# Patient Record
Sex: Male | Born: 1965 | Race: White | Hispanic: No | Marital: Married | Smoking: Never smoker
Health system: Southern US, Community
[De-identification: ages and names within clinical notes are randomized; demographics above are authoritative.]

## PROBLEM LIST (undated history)

## (undated) DIAGNOSIS — I10 Essential (primary) hypertension: Secondary | ICD-10-CM

## (undated) DIAGNOSIS — E785 Hyperlipidemia, unspecified: Secondary | ICD-10-CM

## (undated) HISTORY — DX: Essential (primary) hypertension: I10

## (undated) HISTORY — DX: Hyperlipidemia, unspecified: E78.5

---
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
## 2019-11-30 ENCOUNTER — Ambulatory Visit: Payer: Self-pay | Admitting: Physician Assistant

## 2019-12-05 ENCOUNTER — Ambulatory Visit: Payer: Self-pay | Admitting: Physician Assistant

## 2019-12-08 ENCOUNTER — Encounter: Payer: Self-pay | Admitting: Physician Assistant

## 2019-12-08 ENCOUNTER — Other Ambulatory Visit: Payer: Self-pay

## 2019-12-08 ENCOUNTER — Ambulatory Visit (INDEPENDENT_AMBULATORY_CARE_PROVIDER_SITE_OTHER): Payer: 59 | Admitting: Physician Assistant

## 2019-12-08 VITALS — BP 110/70 | HR 76 | Temp 98.4°F | Resp 16 | Ht 72.0 in | Wt 195.0 lb

## 2019-12-08 DIAGNOSIS — E785 Hyperlipidemia, unspecified: Secondary | ICD-10-CM | POA: Insufficient documentation

## 2019-12-08 DIAGNOSIS — Z8249 Family history of ischemic heart disease and other diseases of the circulatory system: Secondary | ICD-10-CM | POA: Insufficient documentation

## 2019-12-08 DIAGNOSIS — I10 Essential (primary) hypertension: Secondary | ICD-10-CM | POA: Insufficient documentation

## 2019-12-08 DIAGNOSIS — Z23 Encounter for immunization: Secondary | ICD-10-CM | POA: Diagnosis not present

## 2019-12-08 NOTE — Patient Instructions (Signed)
We will work on getting your records to review to make sure everything is up-to-date.   If you can please have your next stress test results sent to [place] it would be appreciated -- Fax is [phone].  Your flu shot was updated today.  It was very nice meeting you today. Welcome to [facility]!

## 2019-12-08 NOTE — Progress Notes (Signed)
Patient presents to clinic today to establish care.  Acute Concerns: Denies acute concern's today.   Chronic Issues: Hypertension -- Notes ongoing history. Father passed from a MI in his 36s. Patient has been getting stress testing every other year. Is scheduled for next in a few weeks. Denies history of abnormal stress test.. Is currently on a regimen of Lotrel 5/20 mg QD, taking daily as directed. Patient denies chest pain, palpitations, lightheadedness, dizziness, vision changes or frequent headaches. Notes diet is well-balanced but he does like [place] food. Has watched portion sizes and lost several pounds in the past year. Is keeping very active.   Hyperlipidemia -- On Pravachol 40 mg daily as directed and tolerating well.   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 No outbreaks.   Health Maintenance: Immunizations -- Due for flu shot. Would like today.  Colonoscopy -- Colonoscopy last year. Was told he had some polyps (about 3-4 per pt report). Recommended recheck in 5 years.   Past Medical History:  Diagnosis Date  . Hyperlipidemia   . Hypertension      The histories are not reviewed yet. Please review them in the "History" navigator section and refresh this SmartLink.  Current Outpatient Medications on File Prior to Visit  Medication Sig Dispense Refill  . acyclovir (ZOVIRAX) 400 MG tablet Take 400 mg by mouth daily.    Marland Kitchen amLODipine-benazepril (LOTREL) 5-20 MG capsule Take 1 capsule by mouth daily.    . pravastatin (PRAVACHOL) 40 MG tablet Take 40 mg by mouth daily.     No current facility-administered medications on file prior to visit.    Not on File  No family history on file.  Social History   Socioeconomic History  . Marital status: Married    Spouse name: Not on file  . Number of children: Not on file  . Years of education: Not on file  . Highest education level: Not on file  Occupational History  . Not on file  Tobacco Use  . Smoking  status: Not on file  Substance and Sexual Activity  . Alcohol use: Not on file  . Drug use: Not on file  . Sexual activity: Not on file  Other Topics Concern  . Not on file  Social History Narrative  . Not on file   Social Determinants of Health   Financial Resource Strain:   . Difficulty of Paying Living Expenses: Not on file  Food Insecurity:   . Worried About Programme researcher, broadcasting/film/video in the Last Year: Not on file  . Ran Out of Food in the Last Year: Not on file  Transportation Needs:   . Lack of Transportation (Medical): Not on file  . Lack of Transportation (Non-Medical): Not on file  Physical Activity:   . Days of Exercise per Week: Not on file  . Minutes of Exercise per Session: Not on file  Stress:   . Feeling of Stress : Not on file  Social Connections:   . Frequency of Communication with Friends and Family: Not on file  . Frequency of Social Gatherings with Friends and Family: Not on file  . Attends Religious Services: Not on file  . Active Member of Clubs or Organizations: Not on file  . Attends Banker Meetings: Not on file  . Marital Status: Not on file  Intimate Partner Violence:   . Fear of Current or Ex-Partner: Not on file  . Emotionally Abused: Not on file  .  Physically Abused: Not on file  . Sexually Abused: Not on file   ROS Pertinent ROS are listed in the HPI.   Resp 16   Ht 6' (1.829 m)   Wt 195 lb (88.5 kg)   BMI 26.45 kg/m   Physical Exam Vitals reviewed.  Constitutional:      Appearance: Normal appearance.  HENT:     Head: Normocephalic and atraumatic.  Cardiovascular:     Rate and Rhythm: Normal rate and regular rhythm.     Pulses: Normal pulses.     Heart sounds: Normal heart sounds.  Pulmonary:     Effort: Pulmonary effort is normal.     Breath sounds: Normal breath sounds.  Musculoskeletal:     Cervical back: Neck supple.  Neurological:     General: No focal deficit present.     Mental Status: He is alert and oriented  to person, place, and time.    Assessment/Plan: 1. Primary hypertension 2. Hyperlipidemia, unspecified hyperlipidemia type 3. Family history of early CAD BP normotensive.  Asymptomatic.  Is taking blood pressure medication and statin as directed.  Endorses recent lab work.  Records requested.  Is up-to-date on stress testing.  Records of this also requested.  We will have him continue current regimen.  Dietary and exercise recommendations reviewed.  4. Need for immunization against influenza - Flu Vaccine QUAD 36+ mos IM  This visit occurred during the SARS-CoV-2 public health emergency.  Safety protocols were in place, including screening questions prior to the visit, additional usage of staff PPE, and extensive cleaning of exam room while observing appropriate contact time as indicated for disinfecting solutions.    Piedad Climes, PA-C

## 2020-04-10 ENCOUNTER — Telehealth: Payer: Self-pay | Admitting: Physician Assistant

## 2020-04-10 DIAGNOSIS — I10 Essential (primary) hypertension: Secondary | ICD-10-CM

## 2020-04-10 DIAGNOSIS — E785 Hyperlipidemia, unspecified: Secondary | ICD-10-CM

## 2020-04-10 DIAGNOSIS — B009 Herpesviral infection, unspecified: Secondary | ICD-10-CM

## 2020-04-10 MED ORDER — ACYCLOVIR 400 MG PO TABS: 400.0000 mg | ORAL_TABLET | Freq: Every day | ORAL | 0 refills | Status: DC

## 2020-04-10 MED ORDER — PRAVASTATIN SODIUM 40 MG PO TABS: 40.0000 mg | ORAL_TABLET | Freq: Every day | ORAL | 0 refills | Status: DC

## 2020-04-10 MED ORDER — AMLODIPINE BESY-BENAZEPRIL HCL 5-20 MG PO CAPS: 1.0000 | ORAL_CAPSULE | Freq: Every day | ORAL | 0 refills | Status: DC

## 2020-04-10 NOTE — Telephone Encounter (Signed)
Rx sent to the preferred patient pharmacy  

## 2020-04-10 NOTE — Telephone Encounter (Signed)
..  Medication Refills  Last OV:  Medication:  Pravostatin, Amlodipine, Acyclovier   Pharmacy:  [facility]   Let patient know to contact pharmacy at the end of the day to make sure medication is ready.   Please notify patient to allow 48-72 hours to process.  Encourage patient to contact the pharmacy for refills or they can request refills through [facility]  Clinical Fills out below:   Last refill:  QTY:  Refill Date:    Other Comments:   Okay for refill?  Please advise.

## 2020-07-19 ENCOUNTER — Ambulatory Visit (INDEPENDENT_AMBULATORY_CARE_PROVIDER_SITE_OTHER): Payer: 59 | Admitting: Family Medicine

## 2020-07-19 ENCOUNTER — Other Ambulatory Visit: Payer: Self-pay

## 2020-07-19 ENCOUNTER — Encounter: Payer: Self-pay | Admitting: Family Medicine

## 2020-07-19 VITALS — BP 132/78 | HR 72 | Temp 98.2°F | Resp 16 | Ht 72.0 in | Wt 189.8 lb

## 2020-07-19 DIAGNOSIS — Z23 Encounter for immunization: Secondary | ICD-10-CM

## 2020-07-19 DIAGNOSIS — B009 Herpesviral infection, unspecified: Secondary | ICD-10-CM

## 2020-07-19 DIAGNOSIS — E785 Hyperlipidemia, unspecified: Secondary | ICD-10-CM

## 2020-07-19 DIAGNOSIS — I10 Essential (primary) hypertension: Secondary | ICD-10-CM

## 2020-07-19 LAB — COMPREHENSIVE METABOLIC PANEL
ALT: 29 U/L (ref 0–53)
AST: 22 U/L (ref 0–37)
Albumin: 4.8 g/dL (ref 3.5–5.2)
Alkaline Phosphatase: 66 U/L (ref 39–117)
BUN: 12 mg/dL (ref 6–23)
CO2: 28 mEq/L (ref 19–32)
Calcium: 9.6 mg/dL (ref 8.4–10.5)
Chloride: 103 mEq/L (ref 96–112)
Creatinine, Ser: 1 mg/dL (ref 0.40–1.50)
GFR: 85.01 mL/min (ref >60.00)
Glucose, Bld: 85 mg/dL (ref 70–99)
Potassium: 4.8 mEq/L (ref 3.5–5.1)
Sodium: 139 mEq/L (ref 135–145)
Total Bilirubin: 0.7 mg/dL (ref 0.2–1.2)
Total Protein: 7.1 g/dL (ref 6.0–8.3)

## 2020-07-19 LAB — LIPID PANEL
Cholesterol: 203 mg/dL — ABNORMAL HIGH (ref 0–200)
HDL: 58.9 mg/dL (ref >39.00)
LDL Cholesterol: 111 mg/dL — ABNORMAL HIGH (ref 0–99)
NonHDL: 143.63
Total CHOL/HDL Ratio: 3
Triglycerides: 165 mg/dL — ABNORMAL HIGH (ref 0.0–149.0)
VLDL: 33 mg/dL (ref 0.0–40.0)

## 2020-07-19 MED ORDER — ACYCLOVIR 400 MG PO TABS: 400.0000 mg | ORAL_TABLET | Freq: Every day | ORAL | 3 refills | Status: DC

## 2020-07-19 MED ORDER — AMLODIPINE BESY-BENAZEPRIL HCL 5-20 MG PO CAPS: 1.0000 | ORAL_CAPSULE | Freq: Every day | ORAL | 3 refills | Status: DC

## 2020-07-19 MED ORDER — PRAVASTATIN SODIUM 40 MG PO TABS: 40.0000 mg | ORAL_TABLET | Freq: Every day | ORAL | 3 refills | Status: DC

## 2020-07-19 NOTE — Progress Notes (Signed)
Subjective:  Patient ID: Vincent Case, male    DOB: 02/13/1965  Age: 55 y.o. MRN: [id number]  CC:  Chief Complaint  Patient presents with   Establish Care    Pt reports here for establish care/TOC reports doing well moved here 1 year ago from [facility]     HPI Vincent Case presents for   Establish care.   Lived in [place] area prior to [place]. Moved 1 year ago.  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   Hypertension: Lotrel 5/20mg  qd. No new side effects.  Home readings: 120/80. BP Readings from Last 3 Encounters:  07/19/20 132/78  12/08/19 110/70   No results found for: CREATININE  Hyperlipidemia: Pravastatin 40mg  qd. No other statin prior. No new side effects/myalgias.  No personal cardiac history. Family history of cardiac disease. Father deceased in 64's with MI.  Had some labs with executive physical 12/14/19.  At total cholesterol 197, triglycerides 149, HDL 55, LDL 116.  CMP within normal limits.  A1c 5.0.  TSH normal at 2.37.  Normal CBC with hemoglobin 13.6.  Normal urinalysis, no proteinuria, negative glucose.  EKG SR, WNL on 12/14/19.  Walking for exercise. No CP with exertion. No new DOE.   No results found for: CHOL, HDL, LDLCALC, LDLDIRECT, TRIG, CHOLHDL No results found for: ALT, AST, GGT, ALKPHOS, BILITOT   Hx of cold sores: Takes acyclovir daily - no recent flair.   Considering covid booster # 2  Shingles vaccine today.    History Patient Active Problem List   Diagnosis Date Noted   Hypertension 12/08/2019   Hyperlipidemia 12/08/2019   Family history of early CAD 12/08/2019   Past Medical History:  Diagnosis Date   Hyperlipidemia    Hypertension    Past Surgical History:  Procedure Laterality Date   FRACTURE SURGERY  1984   No Known Allergies Prior to Admission medications   Medication Sig Start Date End Date Taking? Authorizing Provider  acyclovir (ZOVIRAX) 400 MG tablet Take 1 tablet (400 mg total) by mouth daily. 04/10/20  Yes  06/10/20 B, FNP  amLODipine-benazepril (LOTREL) 5-20 MG capsule Take 1 capsule by mouth daily. 04/10/20  Yes 06/10/20 B, FNP  Multiple Vitamins-Minerals (MENS MULTIVITAMIN PO) Take by mouth.   Yes [provider]  pravastatin (PRAVACHOL) 40 MG tablet Take 1 tablet (40 mg total) by mouth daily. 04/10/20  Yes 06/10/20, FNP   Social History   Socioeconomic History   Marital status: Married    Spouse name: Not on file   Number of children: Not on file   Years of education: Not on file   Highest education level: Not on file  Occupational History   Not on file  Tobacco Use   Smoking status: Never   Smokeless tobacco: Never  Vaping Use   Vaping Use: Never used  Substance and Sexual Activity   Alcohol use: Yes   Drug use: Never   Sexual activity: Yes    Birth control/protection: None  Other Topics Concern   Not on file  Social History Narrative   Not on file   Social Determinants of Health   Financial Resource Strain: Not on file  Food Insecurity: Not on file  Transportation Needs: Not on file  Physical Activity: Not on file  Stress: Not on file  Social Connections: Not on file  Intimate Partner Violence: Not on file    Review of Systems  Constitutional:  Negative for fatigue and unexpected  weight change.  Eyes:  Negative for visual disturbance.  Respiratory:  Negative for cough, chest tightness and shortness of breath.   Cardiovascular:  Negative for chest pain, palpitations and leg swelling.  Gastrointestinal:  Negative for abdominal pain and blood in stool.  Neurological:  Negative for dizziness, light-headedness and headaches.    Objective:   Vitals:   07/19/20 1024  BP: 132/78  Pulse: 72  Resp: 16  Temp: 98.2 F (36.8 C)  TempSrc: Temporal  SpO2: 98%  Weight: 189 lb 12.8 oz (86.1 kg)  Height: 6' (1.829 m)     Physical Exam Vitals reviewed.  Constitutional:      Appearance: He is well-developed.  HENT:     Head: Normocephalic  and atraumatic.  Neck:     Vascular: No carotid bruit or JVD.  Cardiovascular:     Rate and Rhythm: Normal rate and regular rhythm.     Heart sounds: Normal heart sounds. No murmur heard. Pulmonary:     Effort: Pulmonary effort is normal.     Breath sounds: Normal breath sounds. No rales.  Musculoskeletal:     Right lower leg: No edema.     Left lower leg: No edema.  Skin:    General: Skin is warm and dry.  Neurological:     Mental Status: He is alert and oriented to person, place, and time.  Psychiatric:        Mood and Affect: Mood normal.       Assessment & Plan:  Vincent Case is a 55 y.o. male . Hyperlipidemia, unspecified hyperlipidemia type - Plan: pravastatin (PRAVACHOL) 40 MG tablet, Comprehensive metabolic panel, Lipid panel  -Tolerating current regimen.  Would like to see LDL below 100, even towards 70 may be beneficial given family history of early cardiac disease.  Check labs, consider Crestor or Lipitor.  Option of coronary calcium scoring at some point as well.  Executive labs planned in November, can have labs done at that time and bring those to visit with me in 6 months.  HSV-1 infection - Plan: acyclovir (ZOVIRAX) 400 MG tablet  -Stable with daily dosing of acyclovir for prevention.  Increase dose if needed for flare.  Primary hypertension - Plan: amLODipine-benazepril (LOTREL) 5-20 MG capsule  -Stable on current regimen, continue same, labs above.  Need for shingles vaccine - Plan: Varicella-zoster vaccine IM given   Meds ordered this encounter  Medications   acyclovir (ZOVIRAX) 400 MG tablet    Sig: Take 1 tablet (400 mg total) by mouth daily.    Dispense:  90 tablet    Refill:  3   amLODipine-benazepril (LOTREL) 5-20 MG capsule    Sig: Take 1 capsule by mouth daily.    Dispense:  90 capsule    Refill:  3   pravastatin (PRAVACHOL) 40 MG tablet    Sig: Take 1 tablet (40 mg total) by mouth daily.    Dispense:  90 tablet    Refill:  3   Patient  Instructions  Good meeting you today.  No change in meds for now but I would consider a different statin medication depending on your test results.  We will let you know by MyChart.  Follow-up in 6 months for physical but please bring a copy of your labs from your executive physical at that time.  Please let me know if there are questions sooner.    Signed,   Meredith Staggers, MD [facility], [facility] 07/19/20 1:57  PM

## 2020-07-19 NOTE — Patient Instructions (Signed)
Good meeting you today.  No change in meds for now but I would consider a different statin medication depending on your test results.  We will let you know by MyChart.  Follow-up in 6 months for physical but please bring a copy of your labs from your executive physical at that time.  Please let me know if there are questions sooner.

## 2020-07-30 ENCOUNTER — Other Ambulatory Visit: Payer: Self-pay

## 2020-07-30 ENCOUNTER — Encounter: Payer: Self-pay | Admitting: Family Medicine

## 2020-07-30 DIAGNOSIS — E785 Hyperlipidemia, unspecified: Secondary | ICD-10-CM

## 2020-07-30 MED ORDER — ROSUVASTATIN CALCIUM 10 MG PO TABS
10.0000 mg | ORAL_TABLET | Freq: Every day | ORAL | 1 refills | Status: DC
Start: 2020-07-30 — End: 2020-12-23

## 2020-07-30 MED ORDER — ROSUVASTATIN CALCIUM 10 MG PO TABS: 10.0000 mg | ORAL_TABLET | Freq: Every day | ORAL | 1 refills | Status: DC

## 2020-07-30 NOTE — Telephone Encounter (Signed)
Patient is needing this sent to [facility] - [place], [place] - [address]

## 2020-08-02 NOTE — Telephone Encounter (Signed)
Called patient.  Answered questions regarding use of Crestor.  Did note on the instructions to be cautious if regular alcohol use.  Drinks approximately 2 glasses of red wine per day.  Has been doing so with his previous cholesterol medication, normal LFTs at last visit, do not anticipate significant concern with new med but will monitor LFTs at follow-up visit.  All questions were answered with understanding of plan expressed

## 2020-08-28 ENCOUNTER — Ambulatory Visit (INDEPENDENT_AMBULATORY_CARE_PROVIDER_SITE_OTHER): Payer: 59 | Admitting: Family Medicine

## 2020-08-28 ENCOUNTER — Other Ambulatory Visit: Payer: Self-pay

## 2020-08-28 VITALS — BP 126/68 | HR 79 | Temp 98.3°F | Resp 17 | Ht 72.0 in | Wt 191.6 lb

## 2020-08-28 DIAGNOSIS — M542 Cervicalgia: Secondary | ICD-10-CM

## 2020-08-28 DIAGNOSIS — I10 Essential (primary) hypertension: Secondary | ICD-10-CM | POA: Diagnosis not present

## 2020-08-28 DIAGNOSIS — R0789 Other chest pain: Secondary | ICD-10-CM | POA: Diagnosis not present

## 2020-08-28 DIAGNOSIS — E785 Hyperlipidemia, unspecified: Secondary | ICD-10-CM

## 2020-08-28 NOTE — Progress Notes (Signed)
Subjective:  Patient ID: Vincent Case, male    DOB: 1965-07-11  Age: 55 y.o. MRN: [id number]  CC:  Chief Complaint  Patient presents with   Hypertension    Pt here to discuss BP reports some high readings at home denies physical sxs, is noting some tighness of his pectoral or chest wall "like ive done 100 push ups"     HPI Adonus Uselman presents for   Hypertension: Treated with Lotrel 5/20 daily.  Some elevated readings at home when he started feeling some chest symptoms - 135/88. Min dizziness working outside and bending over/standing up a few times.  BP Readings from Last 3 Encounters:  08/28/20 126/68  07/19/20 132/78  12/08/19 110/70   Lab Results  Component Value Date   CREATININE 1.00 07/19/2020   Hyperlipidemia: Previously on pravastatin 40 mg daily, elevated readings noted at recent visit.  Changed to Crestor 10 mg daily. Started 6 days ago after returned from travel. No new side effects or specific new myalgia in past 6 days.   Lab Results  Component Value Date   CHOL 203 (H) 07/19/2020   HDL 58.90 07/19/2020   LDLCALC 111 (H) 07/19/2020   TRIG 165.0 (H) 07/19/2020   CHOLHDL 3 07/19/2020   Lab Results  Component Value Date   ALT 29 07/19/2020   AST 22 07/19/2020   ALKPHOS 66 07/19/2020   BILITOT 0.7 07/19/2020   Chest discomfort Started 8-9 days ago. Noted one morning.  No associated dizziness/dyspnea/nausea/sweating.  Able to feel with moving around/stretching, but not exertion.  No pressure/squeezing. Both sides. Stress test every other year has been ok - last one at [facility] - 2019.  No hx of heart issues, but FH of MI in father in his 36's.  Traveling recently. Some neck/upper back soreness and sensation in chest - similar to feeling after use of butterfly chest machine, but not sore to touch on muscles. Better with new pillow.    History Patient Active Problem List   Diagnosis Date Noted   Hypertension 12/08/2019   Hyperlipidemia 12/08/2019    Family history of early CAD 12/08/2019   Past Medical History:  Diagnosis Date   Hyperlipidemia    Hypertension    Past Surgical History:  Procedure Laterality Date   FRACTURE SURGERY  1984   No Known Allergies Prior to Admission medications   Medication Sig Start Date End Date Taking? Authorizing Provider  acyclovir (ZOVIRAX) 400 MG tablet Take 1 tablet (400 mg total) by mouth daily. 07/19/20  Yes Shade Flood, MD  amLODipine-benazepril (LOTREL) 5-20 MG capsule Take 1 capsule by mouth daily. 07/19/20  Yes Shade Flood, MD  Multiple Vitamins-Minerals (MENS MULTIVITAMIN PO) Take by mouth.   Yes [provider]  rosuvastatin (CRESTOR) 10 MG tablet Take 1 tablet (10 mg total) by mouth daily. 07/30/20  Yes Shade Flood, MD   Social History   Socioeconomic History   Marital status: Married    Spouse name: Not on file   Number of children: Not on file   Years of education: Not on file   Highest education level: Not on file  Occupational History   Not on file  Tobacco Use   Smoking status: Never   Smokeless tobacco: Never  Vaping Use   Vaping Use: Never used  Substance and Sexual Activity   Alcohol use: Yes   Drug use: Never   Sexual activity: Yes    Birth control/protection: None  Other Topics Concern  Not on file  Social History Narrative   Not on file   Social Determinants of Health   Financial Resource Strain: Not on file  Food Insecurity: Not on file  Transportation Needs: Not on file  Physical Activity: Not on file  Stress: Not on file  Social Connections: Not on file  Intimate Partner Violence: Not on file    Review of Systems  Constitutional:  Negative for fatigue and unexpected weight change.  Eyes:  Negative for visual disturbance.  Respiratory:  Negative for cough and shortness of breath.   Cardiovascular:  Negative for chest pain (sensation above, not painful.), palpitations and leg swelling.  Gastrointestinal:  Negative for  abdominal pain and blood in stool.  Neurological:  Negative for dizziness, weakness, light-headedness and headaches.    Objective:   Vitals:   08/28/20 1121  BP: 126/68  Pulse: 79  Resp: 17  Temp: 98.3 F (36.8 C)  TempSrc: Temporal  SpO2: 97%  Weight: 191 lb 9.6 oz (86.9 kg)  Height: 6' (1.829 m)     Physical Exam Vitals reviewed.  Constitutional:      Appearance: He is well-developed.  HENT:     Head: Normocephalic and atraumatic.  Neck:     Vascular: No carotid bruit or JVD.  Cardiovascular:     Rate and Rhythm: Normal rate and regular rhythm.     Heart sounds: Normal heart sounds. No murmur heard. Pulmonary:     Effort: Pulmonary effort is normal.     Breath sounds: Normal breath sounds. No rales.  Musculoskeletal:     Right lower leg: No edema.     Left lower leg: No edema.     Comments: Small nontender, pain-free pectoralis resistance testing, unable to reproduce chest discomfort on exam.  C-spine with near full range of motion, slight discomfort into the upper back but no reproduction of chest symptoms.  Skin:    General: Skin is warm and dry.  Neurological:     Mental Status: He is alert and oriented to person, place, and time.  Psychiatric:        Mood and Affect: Mood normal.     EKG: Sinus rhythm, rate 80.  Unable to read V2.  No apparent significant ST or T wave changes, Q waves.  Compared to December 14, 2018, no apparent significant change   Assessment & Plan:  Kingsten Enfield is a 55 y.o. male . Chest discomfort - Plan: EKG 12-Lead, Ambulatory referral to Cardiology Neck soreness  -Nonspecific/atypical chest discomfort.  Likely musculoskeletal has noticed some neck, upper back issues with recent travel.  Some improvement in those areas with change in pillow and now back home.  EKG without apparent concerning findings, unable to reproduce discomfort on exam.  Does have family history of early cardiac disease.  Previous stress testing reportedly  normal when he was in [place].  -Refer to cardiology locally, with ER/urgent care/RTC precautions regarding chest symptoms.  -Tylenol, symptomatic care discussed for neck, upper back discomfort.  Hyperlipidemia, unspecified hyperlipidemia type - Plan: Ambulatory referral to Cardiology  -Now on Crestor past 6 days.  Tolerating so far and chest symptoms above started prior to change to Crestor.  Recheck in 5 weeks for repeat labs.  Primary hypertension - Plan: Ambulatory referral to Cardiology  -Controlled in office, likely slight elevated readings when stressed about chest symptoms as above.  Handout given on appropriate home monitoring.  Maintenance of hydration discussed when working outside to lessen orthostatic/hypotensive symptoms.  Recheck 5  weeks, sooner if worse.  No orders of the defined types were placed in this encounter.  Patient Instructions  Chest sensation may be related to muscles or pinched nerves from neck and recent travel.  I do not see any obvious concerns on her EKG but will refer you to cardiology.  Be seen right away here, urgent care or emergency room if acute worsening of symptoms.  Tylenol is fine if needed for discomfort in your neck and upper back, gentle range of motion and stretches, heat if needed. Return to the clinic or go to the nearest emergency room if any of your symptoms worsen or new symptoms occur.  No change in medications for now.  Make sure to drink sufficient fluids when you are outside working.  See information below on appropriate monitoring of blood pressure.  Recheck in 5 weeks for repeat cholesterol testing.  Let me know if there are questions sooner.  How to Take Your Blood Pressure Blood pressure is a measurement of how strongly your blood is pressing against the walls of your arteries. Arteries are blood vessels that carry blood from your heart throughout your body. Your health care provider takes your blood pressure at each office visit. You  can also take your own blood pressure athome with a blood pressure monitor. You may need to take your own blood pressure to: Confirm a diagnosis of high blood pressure (hypertension). Monitor your blood pressure over time. Make sure your blood pressure medicine is working. Supplies needed: Blood pressure monitor. Dining room chair to sit in. Table or desk. Small notebook and pencil or pen. How to prepare To get the most accurate reading, avoid the following for 30 minutes before you check your blood pressure: Drinking caffeine. Drinking alcohol. Eating. Smoking. Exercising. Five minutes before you check your blood pressure: Use the bathroom and urinate so that you have an empty bladder. Sit quietly in a dining room chair. Do not sit in a soft couch or an armchair. Do not talk. How to take your blood pressure To check your blood pressure, follow the instructions in the manual that came with your blood pressure monitor. If you have a digital blood pressure monitor, the instructions may be as follows: Sit up straight in a chair. Place your feet on the floor. Do not cross your ankles or legs. Rest your left arm at the level of your heart on a table or desk or on the arm of a chair. Pull up your shirt sleeve. Wrap the blood pressure cuff around the upper part of your left arm, 1 inch (2.5 cm) above your elbow. It is best to wrap the cuff around bare skin. Fit the cuff snugly around your arm. You should be able to place only one finger between the cuff and your arm. Position the cord so that it rests in the bend of your elbow. Press the power button. Sit quietly while the cuff inflates and deflates. Read the digital reading on the monitor screen and write the numbers down (record them) in a notebook. Wait 2-3 minutes, then repeat the steps, starting at step 1. What does my blood pressure reading mean? A blood pressure reading consists of a higher number over a lower number. Ideally, your  blood pressure should be below 120/80. The first ("top") number is called the systolic pressure. It is a measure of the pressure in your arteries as your heart beats. The second ("bottom") number is called the diastolic pressure. It is a measure of  the pressure in your arteries as theheart relaxes. Blood pressure is classified into five stages. The following are the stages for adults who do not have a short-term serious illness or a chronic condition. Systolic pressure and diastolic pressure are measured in a unit called mm Hg (millimeters of mercury). Normal Systolic pressure: below 120. Diastolic pressure: below 80. Elevated Systolic pressure: 120-129. Diastolic pressure: below 80. Hypertension stage 1 Systolic pressure: 130-139. Diastolic pressure: 80-89. Hypertension stage 2 Systolic pressure: 140 or above. Diastolic pressure: 90 or above. You can have elevated blood pressure or hypertension even if only the systolicor only the diastolic number in your reading is higher than normal. Follow these instructions at home: Medicines Take over-the-counter and prescription medicines only as told by your health care provider. Tell your health care provider if you are having any side effects from blood pressure medicine. General instructions Check your blood pressure as often as recommended by your health care provider. Check your blood pressure at the same time every day. Take your monitor to the next appointment with your health care provider to make sure that: You are using it correctly. It provides accurate readings. Understand what your goal blood pressure numbers are. Keep all follow-up visits as told by your health care provider. This is important. General tips Your health care provider can suggest a reliable monitor that will meet your needs. There are several types of home blood pressure monitors. Choose a monitor that has an arm cuff. Do not choose a monitor that measures your blood  pressure from your wrist or finger. Choose a cuff that wraps snugly around your upper arm. You should be able to fit only one finger between your arm and the cuff. You can buy a blood pressure monitor at most drugstores or online. Where to find more information American Heart Association: www.heart.org Contact a health care provider if: Your blood pressure is consistently high. Your blood pressure is suddenly low. Get help right away if: Your systolic blood pressure is higher than 180. Your diastolic blood pressure is higher than 120. Summary Blood pressure is a measurement of how strongly your blood is pressing against the walls of your arteries. A blood pressure reading consists of a higher number over a lower number. Ideally, your blood pressure should be below 120/80. Check your blood pressure at the same time every day. Avoid caffeine, alcohol, smoking, and exercise for 30 minutes prior to checking your blood pressure. These agents can affect the accuracy of the blood pressure reading. This information is not intended to replace advice given to you by your health care provider. Make sure you discuss any questions you have with your healthcare provider. Document Revised: 12/06/2019 Document Reviewed: 01/20/2019 Elsevier Patient Education  2022 Elsevier Inc.    Signed,   Meredith StaggersJeffrey Yaniris Braddock, MD [facility], [facility] 08/28/20 12:34 PM

## 2020-08-28 NOTE — Patient Instructions (Signed)
Chest sensation may be related to muscles or pinched nerves from neck and recent travel.  I do not see any obvious concerns on her EKG but will refer you to cardiology.  Be seen right away here, urgent care or emergency room if acute worsening of symptoms.  Tylenol is fine if needed for discomfort in your neck and upper back, gentle range of motion and stretches, heat if needed. Return to the clinic or go to the nearest emergency room if any of your symptoms worsen or new symptoms occur.  No change in medications for now.  Make sure to drink sufficient fluids when you are outside working.  See information below on appropriate monitoring of blood pressure.  Recheck in 5 weeks for repeat cholesterol testing.  Let me know if there are questions sooner.  How to Take Your Blood Pressure Blood pressure is a measurement of how strongly your blood is pressing against the walls of your arteries. Arteries are blood vessels that carry blood from your heart throughout your body. Your health care provider takes your blood pressure at each office visit. You can also take your own blood pressure athome with a blood pressure monitor. You may need to take your own blood pressure to: Confirm a diagnosis of high blood pressure (hypertension). Monitor your blood pressure over time. Make sure your blood pressure medicine is working. Supplies needed: Blood pressure monitor. Dining room chair to sit in. Table or desk. Small notebook and pencil or pen. How to prepare To get the most accurate reading, avoid the following for 30 minutes before you check your blood pressure: Drinking caffeine. Drinking alcohol. Eating. Smoking. Exercising. Five minutes before you check your blood pressure: Use the bathroom and urinate so that you have an empty bladder. Sit quietly in a dining room chair. Do not sit in a soft couch or an armchair. Do not talk. How to take your blood pressure To check your blood pressure, follow the  instructions in the manual that came with your blood pressure monitor. If you have a digital blood pressure monitor, the instructions may be as follows: Sit up straight in a chair. Place your feet on the floor. Do not cross your ankles or legs. Rest your left arm at the level of your heart on a table or desk or on the arm of a chair. Pull up your shirt sleeve. Wrap the blood pressure cuff around the upper part of your left arm, 1 inch (2.5 cm) above your elbow. It is best to wrap the cuff around bare skin. Fit the cuff snugly around your arm. You should be able to place only one finger between the cuff and your arm. Position the cord so that it rests in the bend of your elbow. Press the power button. Sit quietly while the cuff inflates and deflates. Read the digital reading on the monitor screen and write the numbers down (record them) in a notebook. Wait 2-3 minutes, then repeat the steps, starting at step 1. What does my blood pressure reading mean? A blood pressure reading consists of a higher number over a lower number. Ideally, your blood pressure should be below 120/80. The first ("top") number is called the systolic pressure. It is a measure of the pressure in your arteries as your heart beats. The second ("bottom") number is called the diastolic pressure. It is a measure of the pressure in your arteries as theheart relaxes. Blood pressure is classified into five stages. The following are the stages for  adults who do not have a short-term serious illness or a chronic condition. Systolic pressure and diastolic pressure are measured in a unit called mm Hg (millimeters of mercury). Normal Systolic pressure: below 120. Diastolic pressure: below 80. Elevated Systolic pressure: 120-129. Diastolic pressure: below 80. Hypertension stage 1 Systolic pressure: 130-139. Diastolic pressure: 80-89. Hypertension stage 2 Systolic pressure: 140 or above. Diastolic pressure: 90 or above. You can have  elevated blood pressure or hypertension even if only the systolicor only the diastolic number in your reading is higher than normal. Follow these instructions at home: Medicines Take over-the-counter and prescription medicines only as told by your health care provider. Tell your health care provider if you are having any side effects from blood pressure medicine. General instructions Check your blood pressure as often as recommended by your health care provider. Check your blood pressure at the same time every day. Take your monitor to the next appointment with your health care provider to make sure that: You are using it correctly. It provides accurate readings. Understand what your goal blood pressure numbers are. Keep all follow-up visits as told by your health care provider. This is important. General tips Your health care provider can suggest a reliable monitor that will meet your needs. There are several types of home blood pressure monitors. Choose a monitor that has an arm cuff. Do not choose a monitor that measures your blood pressure from your wrist or finger. Choose a cuff that wraps snugly around your upper arm. You should be able to fit only one finger between your arm and the cuff. You can buy a blood pressure monitor at most drugstores or online. Where to find more information American Heart Association: www.heart.org Contact a health care provider if: Your blood pressure is consistently high. Your blood pressure is suddenly low. Get help right away if: Your systolic blood pressure is higher than 180. Your diastolic blood pressure is higher than 120. Summary Blood pressure is a measurement of how strongly your blood is pressing against the walls of your arteries. A blood pressure reading consists of a higher number over a lower number. Ideally, your blood pressure should be below 120/80. Check your blood pressure at the same time every day. Avoid caffeine, alcohol,  smoking, and exercise for 30 minutes prior to checking your blood pressure. These agents can affect the accuracy of the blood pressure reading. This information is not intended to replace advice given to you by your health care provider. Make sure you discuss any questions you have with your healthcare provider. Document Revised: 12/06/2019 Document Reviewed: 01/20/2019 Elsevier Patient Education  2022 [employer].

## 2020-10-02 ENCOUNTER — Other Ambulatory Visit: Payer: 59

## 2020-10-04 ENCOUNTER — Other Ambulatory Visit: Payer: Self-pay

## 2020-10-04 ENCOUNTER — Ambulatory Visit (INDEPENDENT_AMBULATORY_CARE_PROVIDER_SITE_OTHER): Payer: 59 | Admitting: Family Medicine

## 2020-10-04 ENCOUNTER — Encounter: Payer: Self-pay | Admitting: Family Medicine

## 2020-10-04 VITALS — BP 110/76 | HR 88 | Temp 98.4°F | Resp 16 | Ht 72.0 in | Wt 191.6 lb

## 2020-10-04 DIAGNOSIS — S39012A Strain of muscle, fascia and tendon of lower back, initial encounter: Secondary | ICD-10-CM

## 2020-10-04 DIAGNOSIS — M436 Torticollis: Secondary | ICD-10-CM | POA: Diagnosis not present

## 2020-10-04 DIAGNOSIS — E785 Hyperlipidemia, unspecified: Secondary | ICD-10-CM

## 2020-10-04 DIAGNOSIS — R0789 Other chest pain: Secondary | ICD-10-CM | POA: Diagnosis not present

## 2020-10-04 LAB — LIPID PANEL
Cholesterol: 180 mg/dL (ref 0–200)
HDL: 60.3 mg/dL (ref >39.00)
LDL Cholesterol: 90 mg/dL (ref 0–99)
NonHDL: 119.81
Total CHOL/HDL Ratio: 3
Triglycerides: 147 mg/dL (ref 0.0–149.0)
VLDL: 29.4 mg/dL (ref 0.0–40.0)

## 2020-10-04 LAB — HEPATIC FUNCTION PANEL
ALT: 28 U/L (ref 0–53)
AST: 23 U/L (ref 0–37)
Albumin: 4.7 g/dL (ref 3.5–5.2)
Alkaline Phosphatase: 56 U/L (ref 39–117)
Bilirubin, Direct: 0.2 mg/dL (ref 0.0–0.3)
Total Bilirubin: 1 mg/dL (ref 0.2–1.2)
Total Protein: 7.4 g/dL (ref 6.0–8.3)

## 2020-10-04 MED ORDER — CYCLOBENZAPRINE HCL 5 MG PO TABS: 5.0000 mg | ORAL_TABLET | Freq: Every evening | ORAL | 0 refills | Status: DC | PRN

## 2020-10-04 NOTE — Progress Notes (Signed)
Subjective:  Patient ID: Vincent Case, male    DOB: 04-19-1965  Age: 55 y.o. MRN: [id number]  CC:  Chief Complaint  Patient presents with   Neck Pain    Patient is here for a recheck of neck, chest and labs. Patient states neck is still bothering him   Back Pain    Patient states he thinks he may have lifted something wrong and his lower back has been bothering him    HPI Vincent Case presents for   Chest discomfort Discussed at July 20 visit.  Nonspecific/atypical symptoms at that time, thought to be musculoskeletal as some neck/upper back issues with recent travel and some improvement at that time.  EKG without concerning findings and unable to reproduce discomfort on exam.  Did have a history of family history of early cardiac disease so referred to cardiology.  Tylenol, symptomatic care for neck and upper back discomfort discussed.  Appointment with cardiology September 14.  Chest better- not feeling anything recently.  Neck is still sore at times - stiff in am past year, better overall, loosens up during day and with movement. Has tried change in furniture/desk. Min changes.  Tx: CBD oil for neck - felt much better.  No difficulty sleeping.  No neck XR. No injury/fall. No arm weakness. Occasional finger dysesthesias in middle of night only.   Hyperlipidemia: Previously started on Crestor, had been taking for 6 days at his July 28 visit. No new myalgias, side effects.   Lab Results  Component Value Date   CHOL 203 (H) 07/19/2020   HDL 58.90 07/19/2020   LDLCALC 111 (H) 07/19/2020   TRIG 165.0 (H) 07/19/2020   CHOLHDL 3 07/19/2020   Lab Results  Component Value Date   ALT 29 07/19/2020   AST 22 07/19/2020   ALKPHOS 66 07/19/2020   BILITOT 0.7 07/19/2020   Low back pain Noticed after lifting heavy object few nights ago - noticed soreness next day.  R low back to center. Getting better.  No leg radiation.  No bowel or bladder incontinence, no saddle anesthesia,  no lower extremity weakness.  No fever/unexplained wt loss or night sweats.  Tx: stretches.    History Patient Active Problem List   Diagnosis Date Noted   Hypertension 12/08/2019   Hyperlipidemia 12/08/2019   Family history of early CAD 12/08/2019   Past Medical History:  Diagnosis Date   Hyperlipidemia    Hypertension    Past Surgical History:  Procedure Laterality Date   FRACTURE SURGERY  1984   No Known Allergies Prior to Admission medications   Medication Sig Start Date End Date Taking? Authorizing Provider  acyclovir (ZOVIRAX) 400 MG tablet Take 1 tablet (400 mg total) by mouth daily. 07/19/20  Yes Shade Flood, MD  amLODipine-benazepril (LOTREL) 5-20 MG capsule Take 1 capsule by mouth daily. 07/19/20  Yes Shade Flood, MD  Multiple Vitamins-Minerals (MENS MULTIVITAMIN PO) Take by mouth.   Yes [provider]  rosuvastatin (CRESTOR) 10 MG tablet Take 1 tablet (10 mg total) by mouth daily. 07/30/20  Yes Shade Flood, MD   Social History   Socioeconomic History   Marital status: Married    Spouse name: Not on file   Number of children: Not on file   Years of education: Not on file   Highest education level: Not on file  Occupational History   Not on file  Tobacco Use   Smoking status: Never   Smokeless tobacco: Never  Vaping Use  Vaping Use: Never used  Substance and Sexual Activity   Alcohol use: Yes   Drug use: Never   Sexual activity: Yes    Birth control/protection: None  Other Topics Concern   Not on file  Social History Narrative   Not on file   Social Determinants of Health   Financial Resource Strain: Not on file  Food Insecurity: Not on file  Transportation Needs: Not on file  Physical Activity: Not on file  Stress: Not on file  Social Connections: Not on file  Intimate Partner Violence: Not on file    Review of Systems   Objective:   Vitals:   10/04/20 1021  BP: 110/76  Pulse: 88  Resp: 16  Temp: 98.4 F  (36.9 C)  TempSrc: Temporal  SpO2: 98%  Weight: 191 lb 9.6 oz (86.9 kg)  Height: 6' (1.829 m)     Physical Exam Vitals reviewed.  Constitutional:      Appearance: He is well-developed.  HENT:     Head: Normocephalic and atraumatic.  Neck:     Vascular: No carotid bruit or JVD.  Cardiovascular:     Rate and Rhythm: Normal rate and regular rhythm.     Heart sounds: Normal heart sounds. No murmur heard. Pulmonary:     Effort: Pulmonary effort is normal.     Breath sounds: Normal breath sounds. No rales.  Musculoskeletal:     Right lower leg: No edema.     Left lower leg: No edema.     Comments: C-spine, overall full range of motion, lacking approximately 10 to 15 degrees of extension.  Minimal discomfort, crepitance with range of motion.  No midline bony tenderness or paraspinal tenderness.  Lumbar spine, full range of motion, no midline bony tenderness, able to heel and toe walk without difficulty.  Negative seated straight leg raise.    Skin:    General: Skin is warm and dry.  Neurological:     Mental Status: He is alert and oriented to person, place, and time.  Psychiatric:        Mood and Affect: Mood normal.       Assessment & Plan:  Vincent Case is a 55 y.o. male . Neck stiffness - Plan: cyclobenzaprine (FLEXERIL) 5 MG tablet, DG Cervical Spine Complete  -Possible underlying degenerative changes but overall intact range of motion, doing well with home stretches and range of motion.  Option of Flexeril if needed with potential side effects discussed, check imaging, consider PT.  Low back strain, initial encounter - Plan: cyclobenzaprine (FLEXERIL) 5 MG tablet  -Improving, Flexeril if needed, RTC precautions, handout given.  Reassuring exam.  Deferred imaging at this time.  Hyperlipidemia, unspecified hyperlipidemia type - Plan: Lipid panel, Hepatic function panel  -Tolerating Crestor, check labs.  Chest discomfort  -Improved.  No recent symptoms.  Still  recommend cardiology follow-up given family history but potentially musculoskeletal cause previously.  RTC precautions.  Meds ordered this encounter  Medications   cyclobenzaprine (FLEXERIL) 5 MG tablet    Sig: Take 1 tablet (5 mg total) by mouth at bedtime as needed for muscle spasms (start qhs prn due to sedation).    Dispense:  15 tablet    Refill:  0   Patient Instructions  Please have neck xray at [facility] office.  Muscle relaxant if needed. Continue range of motion/stretches. I would consider physical therapy if continued neck symptoms.  See info on back pain below, follow up if not continuing to improve.  Acute Back Pain, Adult Acute back pain is sudden and usually short-lived. It is often caused by an injury to the muscles and tissues in the back. The injury may result from: A muscle or ligament getting overstretched or torn (strained). Ligaments are tissues that connect bones to each other. Lifting something improperly can cause a back strain. Wear and tear (degeneration) of the spinal disks. Spinal disks are circular tissue that provide cushioning between the bones of the spine (vertebrae). Twisting motions, such as while playing sports or doing yard work. A hit to the back. Arthritis. You may have a physical exam, lab tests, and imaging tests to find the cause ofyour pain. Acute back pain usually goes away with rest and home care. Follow these instructions at home: Managing pain, stiffness, and swelling Treatment may include medicines for pain and inflammation that are taken by mouth or applied to the skin, prescription pain medicine, or muscle relaxants. Take over-the-counter and prescription medicines only as told by your health care provider. Your health care provider may recommend applying ice during the first 24-48 hours after your pain starts. To do this: Put ice in a plastic bag. Place a towel between your skin and the bag. Leave the ice on for 20 minutes, 2-3 times  a day. If directed, apply heat to the affected area as often as told by your health care provider. Use the heat source that your health care provider recommends, such as a moist heat pack or a heating pad. Place a towel between your skin and the heat source. Leave the heat on for 20-30 minutes. Remove the heat if your skin turns bright red. This is especially important if you are unable to feel pain, heat, or cold. You have a greater risk of getting burned. Activity  Do not stay in bed. Staying in bed for more than 1-2 days can delay your recovery. Sit up and stand up straight. Avoid leaning forward when you sit or hunching over when you stand. If you work at a desk, sit close to it so you do not need to lean over. Keep your chin tucked in. Keep your neck drawn back, and keep your elbows bent at a 90-degree angle (right angle). Sit high and close to the steering wheel when you drive. Add lower back (lumbar) support to your car seat, if needed. Take short walks on even surfaces as soon as you are able. Try to increase the length of time you walk each day. Do not sit, drive, or stand in one place for more than 30 minutes at a time. Sitting or standing for long periods of time can put stress on your back. Do not drive or use heavy machinery while taking prescription pain medicine. Use proper lifting techniques. When you bend and lift, use positions that put less stress on your back: [place] your knees. Keep the load close to your body. Avoid twisting. Exercise regularly as told by your health care provider. Exercising helps your back heal faster and helps prevent back injuries by keeping muscles strong and flexible. Work with a physical therapist to make a safe exercise program, as recommended by your health care provider. Do any exercises as told by your physical therapist.  Lifestyle Maintain a healthy weight. Extra weight puts stress on your back and makes it difficult to have good posture. Avoid  activities or situations that make you feel anxious or stressed. Stress and anxiety increase muscle tension and can make back pain worse. Learn ways to  manage anxiety and stress, such as through exercise. General instructions Sleep on a firm mattress in a comfortable position. Try lying on your side with your knees slightly bent. If you lie on your back, put a pillow under your knees. Follow your treatment plan as told by your health care provider. This may include: Cognitive or behavioral therapy. Acupuncture or massage therapy. Meditation or yoga. Contact a health care provider if: You have pain that is not relieved with rest or medicine. You have increasing pain going down into your legs or buttocks. Your pain does not improve after 2 weeks. You have pain at night. You lose weight without trying. You have a fever or chills. Get help right away if: You develop new bowel or bladder control problems. You have unusual weakness or numbness in your arms or legs. You develop nausea or vomiting. You develop abdominal pain. You feel faint. Summary Acute back pain is sudden and usually short-lived. Use proper lifting techniques. When you bend and lift, use positions that put less stress on your back. Take over-the-counter and prescription medicines and apply heat or ice as directed by your health care provider. This information is not intended to replace advice given to you by your health care provider. Make sure you discuss any questions you have with your healthcare provider. Document Revised: 10/17/2019 Document Reviewed: 10/20/2019 Elsevier Patient Education  2022 Elsevier Inc.    Signed,   Meredith StaggersJeffrey Borghild Thaker, MD [facility], [facility] 10/04/20 12:52 PM

## 2020-10-04 NOTE — Patient Instructions (Signed)
Please have neck xray at [facility] office.  Muscle relaxant if needed. Continue range of motion/stretches. I would consider physical therapy if continued neck symptoms.  See info on back pain below, follow up if not continuing to improve.   Acute Back Pain, Adult Acute back pain is sudden and usually short-lived. It is often caused by an injury to the muscles and tissues in the back. The injury may result from: A muscle or ligament getting overstretched or torn (strained). Ligaments are tissues that connect bones to each other. Lifting something improperly can cause a back strain. Wear and tear (degeneration) of the spinal disks. Spinal disks are circular tissue that provide cushioning between the bones of the spine (vertebrae). Twisting motions, such as while playing sports or doing yard work. A hit to the back. Arthritis. You may have a physical exam, lab tests, and imaging tests to find the cause ofyour pain. Acute back pain usually goes away with rest and home care. Follow these instructions at home: Managing pain, stiffness, and swelling Treatment may include medicines for pain and inflammation that are taken by mouth or applied to the skin, prescription pain medicine, or muscle relaxants. Take over-the-counter and prescription medicines only as told by your health care provider. Your health care provider may recommend applying ice during the first 24-48 hours after your pain starts. To do this: Put ice in a plastic bag. Place a towel between your skin and the bag. Leave the ice on for 20 minutes, 2-3 times a day. If directed, apply heat to the affected area as often as told by your health care provider. Use the heat source that your health care provider recommends, such as a moist heat pack or a heating pad. Place a towel between your skin and the heat source. Leave the heat on for 20-30 minutes. Remove the heat if your skin turns bright red. This is especially important if you are  unable to feel pain, heat, or cold. You have a greater risk of getting burned. Activity  Do not stay in bed. Staying in bed for more than 1-2 days can delay your recovery. Sit up and stand up straight. Avoid leaning forward when you sit or hunching over when you stand. If you work at a desk, sit close to it so you do not need to lean over. Keep your chin tucked in. Keep your neck drawn back, and keep your elbows bent at a 90-degree angle (right angle). Sit high and close to the steering wheel when you drive. Add lower back (lumbar) support to your car seat, if needed. Take short walks on even surfaces as soon as you are able. Try to increase the length of time you walk each day. Do not sit, drive, or stand in one place for more than 30 minutes at a time. Sitting or standing for long periods of time can put stress on your back. Do not drive or use heavy machinery while taking prescription pain medicine. Use proper lifting techniques. When you bend and lift, use positions that put less stress on your back: [place] your knees. Keep the load close to your body. Avoid twisting. Exercise regularly as told by your health care provider. Exercising helps your back heal faster and helps prevent back injuries by keeping muscles strong and flexible. Work with a physical therapist to make a safe exercise program, as recommended by your health care provider. Do any exercises as told by your physical therapist.  Lifestyle Maintain a healthy weight.  Extra weight puts stress on your back and makes it difficult to have good posture. Avoid activities or situations that make you feel anxious or stressed. Stress and anxiety increase muscle tension and can make back pain worse. Learn ways to manage anxiety and stress, such as through exercise. General instructions Sleep on a firm mattress in a comfortable position. Try lying on your side with your knees slightly bent. If you lie on your back, put a pillow under your  knees. Follow your treatment plan as told by your health care provider. This may include: Cognitive or behavioral therapy. Acupuncture or massage therapy. Meditation or yoga. Contact a health care provider if: You have pain that is not relieved with rest or medicine. You have increasing pain going down into your legs or buttocks. Your pain does not improve after 2 weeks. You have pain at night. You lose weight without trying. You have a fever or chills. Get help right away if: You develop new bowel or bladder control problems. You have unusual weakness or numbness in your arms or legs. You develop nausea or vomiting. You develop abdominal pain. You feel faint. Summary Acute back pain is sudden and usually short-lived. Use proper lifting techniques. When you bend and lift, use positions that put less stress on your back. Take over-the-counter and prescription medicines and apply heat or ice as directed by your health care provider. This information is not intended to replace advice given to you by your health care provider. Make sure you discuss any questions you have with your healthcare provider. Document Revised: 10/17/2019 Document Reviewed: 10/20/2019 Elsevier Patient Education  2022 [employer].

## 2020-10-17 ENCOUNTER — Other Ambulatory Visit: Payer: Self-pay

## 2020-10-17 ENCOUNTER — Ambulatory Visit (INDEPENDENT_AMBULATORY_CARE_PROVIDER_SITE_OTHER)
Admission: RE | Admit: 2020-10-17 | Discharge: 2020-10-17 | Disposition: A | Discharge status: Home / Self Care | Payer: 59 | Source: Ambulatory Visit | Attending: Family Medicine | Admitting: Family Medicine

## 2020-10-17 DIAGNOSIS — M436 Torticollis: Secondary | ICD-10-CM

## 2020-10-23 ENCOUNTER — Other Ambulatory Visit: Payer: Self-pay

## 2020-10-23 ENCOUNTER — Ambulatory Visit (INDEPENDENT_AMBULATORY_CARE_PROVIDER_SITE_OTHER): Payer: 59 | Admitting: Cardiology

## 2020-10-23 ENCOUNTER — Encounter (HOSPITAL_BASED_OUTPATIENT_CLINIC_OR_DEPARTMENT_OTHER): Payer: Self-pay | Admitting: Cardiology

## 2020-10-23 VITALS — BP 126/80 | HR 73 | Ht 72.0 in | Wt 194.0 lb

## 2020-10-23 DIAGNOSIS — Z8249 Family history of ischemic heart disease and other diseases of the circulatory system: Secondary | ICD-10-CM

## 2020-10-23 DIAGNOSIS — R079 Chest pain, unspecified: Secondary | ICD-10-CM | POA: Diagnosis not present

## 2020-10-23 DIAGNOSIS — E785 Hyperlipidemia, unspecified: Secondary | ICD-10-CM

## 2020-10-23 DIAGNOSIS — I10 Essential (primary) hypertension: Secondary | ICD-10-CM

## 2020-10-23 DIAGNOSIS — Z7189 Other specified counseling: Secondary | ICD-10-CM

## 2020-10-23 DIAGNOSIS — Z713 Dietary counseling and surveillance: Secondary | ICD-10-CM

## 2020-10-23 MED ORDER — METOPROLOL TARTRATE 50 MG PO TABS: ORAL_TABLET | ORAL | 0 refills | Status: DC

## 2020-10-23 NOTE — Patient Instructions (Signed)
Medication Instructions:  Your physician recommends that you continue on your current medications as directed. Please refer to the Current Medication list given to you today.  *If you need a refill on your cardiac medications before your next appointment, please call your pharmacy*   Lab Work: BMET prior to CT scan (if not completed at your primary doctor)  If you have labs (blood work) drawn today and your tests are completely normal, you will receive your results only by: MyChart Message (if you have MyChart) OR A paper copy in the mail If you have any lab test that is abnormal or we need to change your treatment, we will call you to review the results.   Testing/Procedures: Coronary CTA-see instructions below  Follow-Up: At [facility], you and your health needs are our priority.  As part of our continuing mission to provide you with exceptional heart care, we have created designated Provider Care Teams.  These Care Teams include your primary Cardiologist (physician) and Advanced Practice Providers (APPs -  Physician Assistants and Nurse Practitioners) who all work together to provide you with the care you need, when you need it.  We recommend signing up for the patient portal called "MyChart".  Sign up information is provided on this After Visit Summary.  MyChart is used to connect with patients for Virtual Visits (Telemedicine).  Patients are able to view lab/test results, encounter notes, upcoming appointments, etc.  Non-urgent messages can be sent to your provider as well.   To learn more about what you can do with MyChart, go to [email or web address].    Your next appointment:   Pending test results with Dr. Cristal Deer  Other Instructions   Your cardiac CT will be scheduled at one of the below locations:   [facility] [address] [place], [place] [address] [phone]  If scheduled at [facility], please arrive at the [facility] main  entrance (entrance A) of [facility] 30 minutes prior to test start time. Proceed to the [facility] Radiology Department (first floor) to check-in and test prep.  Please follow these instructions carefully (unless otherwise directed):  Hold all erectile dysfunction medications at least 3 days (72 hrs) prior to test.  On the Night Before the Test: Be sure to Drink plenty of water. Do not consume any caffeinated/decaffeinated beverages or chocolate 12 hours prior to your test. Do not take any antihistamines 12 hours prior to your test.  On the Day of the Test: Drink plenty of water until 1 hour prior to the test. Do not eat any food 4 hours prior to the test. You may take your regular medications prior to the test.  Take metoprolol (Lopressor) two hours prior to test.      After the Test: Drink plenty of water. After receiving IV contrast, you may experience a mild flushed feeling. This is normal. On occasion, you may experience a mild rash up to 24 hours after the test. This is not dangerous. If this occurs, you can take Benadryl 25 mg and increase your fluid intake. If you experience trouble breathing, this can be serious. If it is severe call 911 IMMEDIATELY. If it is mild, please call our office. If you take any of these medications: Glipizide/Metformin, Avandament, Glucavance, please do not take 48 hours after completing test unless otherwise instructed.  Please allow 2-4 weeks for scheduling of routine cardiac CTs. Some insurance companies require a pre-authorization which may delay scheduling of this test.  For non-scheduling related questions, please contact the cardiac imaging nurse navigator should you have any questions/concerns: [place]well [place], Cardiac Imaging Nurse Navigator Larey Brick, Cardiac Imaging Nurse Navigator [facility] Direct Office Dial: [phone]   For scheduling needs, including cancellations and rescheduling, please  call [place], [phone].

## 2020-10-23 NOTE — Progress Notes (Signed)
Cardiology Office Note:    Date:  10/23/2020   ID:  Vincent Case, DOB Jan 26, 1966, MRN [id number]  PCP:  Shade Flood, MD  Cardiologist:  Jodelle Red, MD  Referring MD: Shade Flood, MD   CC: new patient consultation for chest pain  History of Present Illness:    Vincent Case is a 55 y.o. male with a hx of hypertension, hyperlipidemia, family history of CAD who is seen as a new consult at the request of Shade Flood, MD for the evaluation and management of chest pain.  Note from 10/04/20 with Dr. Neva Seat reviewed. Had noted atypical chest pain in July, resolved at that visit.  Today: Chest pain: -Quality: wouldn't call it pain. Would call it tightness. Felt initially in high bilateral lateral pec area. Has also had neck discomfort in lower posterior neck for the last year. 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 -Onset: several months to a year -Frequency/duration: constant, could be every day. Never radiated. -Associated symptoms: no nausea, diaphoresis, shortness of breath -Aggravating/alleviating factors: not different if he pushed on the area, but felt better when he stretched  -Prior cardiac history: none -Prior workup: used to do a stress test every other year with his physical, has had 5-6, all normal. Has had echo done as well. All done in [place], [place]. Always told it was normal -Prior treatment: none -Alcohol: several glasses of red wine/night, and occasional beer -Tobacco: never -Comorbidities: hypertension, hyperlipidemia. Denies diabetes, kidney issues -Exercise level: used to be more active, but has been concerned with recent findings in his neck x-rays. Walks the dog, used to play more soccer and baseball. No physical limitations -Cardiac ROS: no shortness of breath, no PND, no orthopnea, no LE edema, no syncope -Family history: biological father died when patient was 2, father was in his mid-40s. Had a  heart attack, was a smoker. No other history known. Mother has been generally healthy, recently had a heart issue at age 51 (not sure what it is, will get more information). Has had hypertension.  No siblings.  Past Medical History:  Diagnosis Date   Hyperlipidemia    Hypertension     Past Surgical History:  Procedure Laterality Date   FRACTURE SURGERY  1984    Current Medications: Current Outpatient Medications on File Prior to Visit  Medication Sig   acyclovir (ZOVIRAX) 400 MG tablet Take 1 tablet (400 mg total) by mouth daily.   amLODipine-benazepril (LOTREL) 5-20 MG capsule Take 1 capsule by mouth daily.   Multiple Vitamins-Minerals (MENS MULTIVITAMIN PO) Take by mouth.   rosuvastatin (CRESTOR) 10 MG tablet Take 1 tablet (10 mg total) by mouth daily.   cyclobenzaprine (FLEXERIL) 5 MG tablet Take 1 tablet (5 mg total) by mouth at bedtime as needed for muscle spasms (start qhs prn due to sedation). (Patient not taking: Reported on 10/23/2020)   No current facility-administered medications on file prior to visit.     Allergies:   Patient has no known allergies.   Social History   Tobacco Use   Smoking status: Never   Smokeless tobacco: Never  Vaping Use   Vaping Use: Never used  Substance Use Topics   Alcohol use: Yes   Drug use: Never    Family History: family history includes Cancer in his maternal grandmother; Early death in his father.  ROS:   Please see the history of present illness.  Additional pertinent ROS: Constitutional: Negative for chills, fever,  night sweats, unintentional weight loss  HENT: Negative for ear pain and hearing loss.   Eyes: Negative for loss of vision and eye pain.  Respiratory: Negative for cough, sputum, wheezing.   Cardiovascular: See HPI. Gastrointestinal: Negative for abdominal pain, melena, and hematochezia.  Genitourinary: Negative for dysuria and hematuria.  Musculoskeletal: Negative for falls and myalgias.  Skin: Negative for  itching and rash.  Neurological: Negative for focal weakness, focal sensory changes and loss of consciousness.  Endo/Heme/Allergies: Does not bruise/bleed easily.     EKGs/Labs/Other Studies Reviewed:    The following studies were reviewed today: Echo 2019 scanned in our system, EF 60%, otherwise unremarkable.  Reports multiple prior treadmill stress tests being good.  EKG:  EKG is personally reviewed.   10/23/20 NSR at 73 bpm  Recent Labs: 07/19/2020: BUN 12; Creatinine, Ser 1.00; Potassium 4.8; Sodium 139 10/04/2020: ALT 28  Recent Lipid Panel    Component Value Date/Time   CHOL 180 10/04/2020 1128   TRIG 147.0 10/04/2020 1128   HDL 60.30 10/04/2020 1128   CHOLHDL 3 10/04/2020 1128   VLDL 29.4 10/04/2020 1128   LDLCALC 90 10/04/2020 1128    Physical Exam:    VS:  BP 126/80 (BP Location: Left Arm, Patient Position: Sitting, Cuff Size: Normal)   Pulse 73   Ht 6' (1.829 m)   Wt 194 lb (88 kg)   BMI 26.31 kg/m     Wt Readings from Last 3 Encounters:  10/23/20 194 lb (88 kg)  10/04/20 191 lb 9.6 oz (86.9 kg)  08/28/20 191 lb 9.6 oz (86.9 kg)    GEN: Well nourished, well developed in no acute distress HEENT: Normal, moist mucous membranes NECK: No JVD CARDIAC: regular rhythm, normal S1 and S2, no rubs or gallops. No murmur. VASCULAR: Radial and DP pulses 2+ bilaterally. No carotid bruits RESPIRATORY:  Clear to auscultation without rales, wheezing or rhonchi  ABDOMEN: Soft, non-tender, non-distended MUSCULOSKELETAL:  Ambulates independently SKIN: Warm and dry, no edema NEUROLOGIC:  Alert and oriented x 3. No focal neuro deficits noted. PSYCHIATRIC:  Normal affect    ASSESSMENT:    1. Chest pain of uncertain etiology   2. Essential hypertension   3. Hyperlipidemia, unspecified hyperlipidemia type   4. Family history of heart disease   5. Cardiac risk counseling   6. Nutritional counseling    PLAN:    Chest pain Family history of premature CAD in father (died  of MI in his 35s) We spent significant time today reviewing different parts of the cardiovascular system (electrical, vascular, functional, and valvular). We discussed how each of these systems can present with different symptoms. We reviewed that there are different ways we evaluate these symptoms with tests. We reviewed which tests I think are most appropriate given the symptoms, and we discussed risks/benefits and limitations of each of these tests. Please see summary below. We also discussed that if testing is unrevealing for a cardiac cause of the symptoms, there are many noncardiac causes as well that can contribute to symptoms. If the heart is ruled out, then I recommend returning to PCP to discuss alternative diagnoses.  -discussed treadmill stress, nuclear stress/lexiscan, and CT coronary angiography. Discussed pros and cons of each, including but not limited to false positive/false negative risk, radiation risk, and risk of IV contrast dye. Based on shared decision making, decision was made to pursue CT coronary angiography. -will give one time dose of metoprolol 2 hours prior to scheduled test -counseled on need to get  BMET prior to test -counseled on use of sublingual nitroglycerin and its importance to a good test  -discussed red flag warning signs   Hypertension: at goal today -continue amlodipine-benazepril  Hypcholesterolemia -last LDL 90, continue rosuvastatin 10 mg, increase if CAD seen on scan. Also discussed aspirin if disease seen on scan  Cardiac risk counseling and prevention recommendations: -recommend heart healthy/Mediterranean diet, with whole grains, fruits, vegetable, fish, lean meats, nuts, and olive oil. Limit salt. -recommend moderate walking, 3-5 times/week for 30-50 minutes each session. Aim for at least 150 minutes.week. Goal should be pace of 3 miles/hours, or walking 1.5 miles in 30 minutes -recommend avoidance of tobacco products. Avoid excess alcohol. -ASCVD  risk score: The 10-year ASCVD risk score (Arnett DK, et al., 2019) is: 4.8%   Values used to calculate the score:     Age: 13 years     Sex: Male     Is Non-Hispanic African American: No     Diabetic: No     Tobacco smoker: No     Systolic Blood Pressure: 126 mmHg     Is BP treated: Yes     HDL Cholesterol: 60.3 mg/dL     Total Cholesterol: 180 mg/dL    Plan for follow up: to be determined based on results of testing  Jodelle Red, MD, PhD, [facility] [place]  [facility]    Medication Adjustments/Labs and Tests Ordered: Current medicines are reviewed at length with the patient today.  Concerns regarding medicines are outlined above.  Orders Placed This Encounter  Procedures   CT CORONARY MORPH W/CTA COR W/SCORE W/CA W/CM &/OR WO/CM   Basic metabolic panel   EKG 12-Lead    Meds ordered this encounter  Medications   DISCONTD: metoprolol tartrate (LOPRESSOR) 50 MG tablet    Sig: Take 1 tablet (50 mg) TWO hours prior to CT scan    Dispense:  1 tablet    Refill:  0   metoprolol tartrate (LOPRESSOR) 50 MG tablet    Sig: Take 1 tablet (50 mg) TWO hours prior to CT scan    Dispense:  1 tablet    Refill:  0     Patient Instructions  Medication Instructions:  Your physician recommends that you continue on your current medications as directed. Please refer to the Current Medication list given to you today.  *If you need a refill on your cardiac medications before your next appointment, please call your pharmacy*   Lab Work: BMET prior to CT scan (if not completed at your primary doctor)  If you have labs (blood work) drawn today and your tests are completely normal, you will receive your results only by: MyChart Message (if you have MyChart) OR A paper copy in the mail If you have any lab test that is abnormal or we need to change your treatment, we will call you to review the results.   Testing/Procedures: Coronary CTA-see instructions  below  Follow-Up: At [facility], you and your health needs are our priority.  As part of our continuing mission to provide you with exceptional heart care, we have created designated Provider Care Teams.  These Care Teams include your primary Cardiologist (physician) and Advanced Practice Providers (APPs -  Physician Assistants and Nurse Practitioners) who all work together to provide you with the care you need, when you need it.  We recommend signing up for the patient portal called "MyChart".  Sign up information is provided on this After Visit Summary.  MyChart is  used to connect with patients for Virtual Visits (Telemedicine).  Patients are able to view lab/test results, encounter notes, upcoming appointments, etc.  Non-urgent messages can be sent to your provider as well.   To learn more about what you can do with MyChart, go to [email or web address].    Your next appointment:   Pending test results with Dr. Cristal Deer  Other Instructions   Your cardiac CT will be scheduled at one of the below locations:   [facility] [address] [place], [place] [address] [phone]  If scheduled at [facility], please arrive at the [facility] main entrance (entrance A) of [facility] 30 minutes prior to test start time. Proceed to the [facility] Radiology Department (first floor) to check-in and test prep.  Please follow these instructions carefully (unless otherwise directed):  Hold all erectile dysfunction medications at least 3 days (72 hrs) prior to test.  On the Night Before the Test: Be sure to Drink plenty of water. Do not consume any caffeinated/decaffeinated beverages or chocolate 12 hours prior to your test. Do not take any antihistamines 12 hours prior to your test.  On the Day of the Test: Drink plenty of water until 1 hour prior to the test. Do not eat any food 4 hours prior to the test. You may take your regular medications prior to  the test.  Take metoprolol (Lopressor) two hours prior to test.      After the Test: Drink plenty of water. After receiving IV contrast, you may experience a mild flushed feeling. This is normal. On occasion, you may experience a mild rash up to 24 hours after the test. This is not dangerous. If this occurs, you can take Benadryl 25 mg and increase your fluid intake. If you experience trouble breathing, this can be serious. If it is severe call 911 IMMEDIATELY. If it is mild, please call our office. If you take any of these medications: Glipizide/Metformin, Avandament, Glucavance, please do not take 48 hours after completing test unless otherwise instructed.  Please allow 2-4 weeks for scheduling of routine cardiac CTs. Some insurance companies require a pre-authorization which may delay scheduling of this test.   For non-scheduling related questions, please contact the cardiac imaging nurse navigator should you have any questions/concerns: Rockwell [place], Cardiac Imaging Nurse Navigator Larey Brick, Cardiac Imaging Nurse Navigator [facility] Direct Office Dial: [phone]   For scheduling needs, including cancellations and rescheduling, please call [place], [phone].   Signed, Jodelle Red, MD PhD 10/23/2020 9:41 AM    [facility]

## 2020-10-29 ENCOUNTER — Telehealth (HOSPITAL_COMMUNITY): Payer: Self-pay | Admitting: *Deleted

## 2020-10-29 NOTE — Telephone Encounter (Signed)
Attempted to call patient regarding upcoming cardiac CT appointment. °Left message on voicemail with name and callback number ° °Alec Mcphee RN Navigator Cardiac Imaging °[facility] °[phone] Office °[phone] Cell ° °

## 2020-10-29 NOTE — Telephone Encounter (Signed)
Reaching out to patient to offer assistance regarding upcoming cardiac imaging study; pt verbalizes understanding of appt date/time, parking situation and where to check in, pre-test NPO status and medications ordered, and verified current allergies; name and call back number provided for further questions should they arise  Deston Bilyeu RN Navigator Cardiac Imaging [facility] [phone] office [phone] cell  Patient to take 50mg metoprolol tartrate two hours prior to cardiac CT scan. 

## 2020-10-30 ENCOUNTER — Ambulatory Visit (HOSPITAL_COMMUNITY): Payer: 59

## 2020-10-31 ENCOUNTER — Other Ambulatory Visit: Payer: Self-pay

## 2020-10-31 ENCOUNTER — Ambulatory Visit (HOSPITAL_COMMUNITY)
Admission: RE | Admit: 2020-10-31 | Discharge: 2020-10-31 | Disposition: A | Discharge status: Home / Self Care | Payer: 59 | Source: Ambulatory Visit | Attending: Cardiology | Admitting: Cardiology

## 2020-10-31 DIAGNOSIS — R079 Chest pain, unspecified: Secondary | ICD-10-CM | POA: Insufficient documentation

## 2020-10-31 LAB — BASIC METABOLIC PANEL
BUN/Creatinine Ratio: 11 (ref 9–20)
BUN: 13 mg/dL (ref 6–24)
CO2: 24 mmol/L (ref 20–29)
Calcium: 9.4 mg/dL (ref 8.7–10.2)
Chloride: 101 mmol/L (ref 96–106)
Creatinine, Ser: 1.15 mg/dL (ref 0.76–1.27)
Glucose: 92 mg/dL (ref 65–99)
Potassium: 4.9 mmol/L (ref 3.5–5.2)
Sodium: 142 mmol/L (ref 134–144)
eGFR: 75 mL/min/{1.73_m2} (ref >59)

## 2020-10-31 MED ORDER — IOHEXOL 350 MG/ML SOLN
95.0000 mL | Freq: Once | INTRAVENOUS | Status: AC | PRN
  Administered 2020-10-31: 95 mL via INTRAVENOUS

## 2020-10-31 MED ORDER — NITROGLYCERIN 0.4 MG SL SUBL
0.8000 mg | SUBLINGUAL_TABLET | Freq: Once | SUBLINGUAL | Status: AC
  Administered 2020-10-31: 0.8 mg via SUBLINGUAL

## 2020-10-31 MED ORDER — NITROGLYCERIN 0.4 MG SL SUBL
SUBLINGUAL_TABLET | SUBLINGUAL | Status: AC
  Filled 2020-10-31: qty 2

## 2020-12-23 ENCOUNTER — Ambulatory Visit (INDEPENDENT_AMBULATORY_CARE_PROVIDER_SITE_OTHER): Payer: 59 | Admitting: Family Medicine

## 2020-12-23 VITALS — BP 122/74 | HR 77 | Temp 98.1°F | Resp 16 | Ht 72.0 in | Wt 200.2 lb

## 2020-12-23 DIAGNOSIS — Z87898 Personal history of other specified conditions: Secondary | ICD-10-CM | POA: Diagnosis not present

## 2020-12-23 DIAGNOSIS — I10 Essential (primary) hypertension: Secondary | ICD-10-CM

## 2020-12-23 DIAGNOSIS — Z0000 Encounter for general adult medical examination without abnormal findings: Secondary | ICD-10-CM

## 2020-12-23 DIAGNOSIS — Z23 Encounter for immunization: Secondary | ICD-10-CM

## 2020-12-23 DIAGNOSIS — E785 Hyperlipidemia, unspecified: Secondary | ICD-10-CM | POA: Diagnosis not present

## 2020-12-23 MED ORDER — ROSUVASTATIN CALCIUM 20 MG PO TABS: 20.0000 mg | ORAL_TABLET | Freq: Every day | ORAL | 1 refills | Status: DC

## 2020-12-23 NOTE — Progress Notes (Signed)
Subjective:  Patient ID: Vincent Case, male    DOB: Mar 06, 1965  Age: 55 y.o. MRN: [id number]  CC:  Chief Complaint  Patient presents with   Annual Exam    Pt reports had a physical from work including blood work     HPI Vincent Case presents for   Annual physical exam.   Recent executive health physical at work - had bloodwork, evaluation at that visit.   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   Hypertension: Amlodipine benazepril 5/20 mg daily Cardiology evaluation in September for chest pain.  CT coronary morphology was CTA coronary calcium scoring on September 26 without significant incidental findings, CCS score 16, 57th percentile.  Recommended increase of his rosuvastatin from 10 to 20 mg.  25-month lipid follow-up, 54-month in person follow-up with discussion of aspirin at that time.  Dr. Cristal Deer. No new side effects.   BP Readings from Last 3 Encounters:  12/23/20 122/74  10/31/20 115/81  10/23/20 126/80   Lab Results  Component Value Date   CREATININE 1.15 10/30/2020   Hyperlipidemia: Crestor 10 mg daily. Recs form cardiology above for higher dose.  Changed form pravastatin to crestor in June. Improved LDL from 111 to 90 with this change.  FH of CAD with MI in his 65's.  Recent lipids on exec physical 11/20/20: Total 199, trig 104, HDL 68, LDL 113.  CMP normal.  A1c 5.1.  TSH normal at 2.02.  CBC normal with hemoglobin 16.6.  Platelets 159.  UA negative for blood, negative for protein, glucose.  PSA 2.1. He did have an EKG which also be scanned showing sinus rhythm.  Rate 64.  Negative precordial T waves V1, V2. Lab Results  Component Value Date   CHOL 180 10/04/2020   HDL 60.30 10/04/2020   LDLCALC 90 10/04/2020   TRIG 147.0 10/04/2020   CHOLHDL 3 10/04/2020   Lab Results  Component Value Date   ALT 28 10/04/2020   AST 23 10/04/2020   ALKPHOS 56 10/04/2020   BILITOT 1.0 10/04/2020    Cancer Screening: Colon cancer screening:  colonoscopy 2 years ago in [place], repeat 5 yrs.  The natural history of prostate cancer and ongoing controversy regarding screening and potential treatment outcomes of prostate cancer has been discussed with the patient. The meaning of a false positive PSA and a false negative PSA has been discussed. He indicates understanding of the limitations of this screening test. Recent PSA noted above.  Derm - no skin CA, had derm in [place], [place].   Immunization History  Administered Date(s) Administered   Influenza,inj,Quad PF,6+ Mos 12/08/2019   PFIZER(Purple Top)SARS-COV-2 Vaccination 03/13/2019, 04/03/2019, 01/08/2020   Tdap 02/09/2017   Zoster Recombinat (Shingrix) 07/19/2020  2nd shingrix  today.  Flu and covid booster in September.   Alcohol: 14 per week.   Tobacco: None.   Dentist every 3-4 months.   Optho: Vision Screening   Right eye Left eye Both eyes  Without correction 20/40 20/30 20/30   With correction     Plans to establish with optho - years since last visit.   Exercise:  Walking daily -1 mile.  Some soccer. Scaled back on exercise after neck issues.     History Patient Active Problem List   Diagnosis Date Noted   Hypertension 12/08/2019   Hyperlipidemia 12/08/2019   Family history of early CAD 12/08/2019   Past Medical History:  Diagnosis Date   Hyperlipidemia    Hypertension    Past Surgical History:  Procedure  Laterality Date   FRACTURE SURGERY  1984   No Known Allergies Prior to Admission medications   Medication Sig Start Date End Date Taking? Authorizing Provider  acyclovir (ZOVIRAX) 400 MG tablet Take 1 tablet (400 mg total) by mouth daily. 07/19/20  Yes Shade Flood, MD  amLODipine-benazepril (LOTREL) 5-20 MG capsule Take 1 capsule by mouth daily. 07/19/20  Yes Shade Flood, MD  metoprolol tartrate (LOPRESSOR) 50 MG tablet Take 1 tablet (50 mg) TWO hours prior to CT scan 10/23/20  Yes Jodelle Red, MD  Multiple Vitamins-Minerals  (MENS MULTIVITAMIN PO) Take by mouth.   Yes [provider]  rosuvastatin (CRESTOR) 10 MG tablet Take 1 tablet (10 mg total) by mouth daily. 07/30/20  Yes Shade Flood, MD  cyclobenzaprine (FLEXERIL) 5 MG tablet Take 1 tablet (5 mg total) by mouth at bedtime as needed for muscle spasms (start qhs prn due to sedation). Patient not taking: No sig reported 10/04/20   Shade Flood, MD   Social History   Socioeconomic History   Marital status: Married    Spouse name: Not on file   Number of children: Not on file   Years of education: Not on file   Highest education level: Not on file  Occupational History   Not on file  Tobacco Use   Smoking status: Never   Smokeless tobacco: Never  Vaping Use   Vaping Use: Never used  Substance and Sexual Activity   Alcohol use: Yes   Drug use: Never   Sexual activity: Yes    Birth control/protection: None  Other Topics Concern   Not on file  Social History Narrative   Not on file   Social Determinants of Health   Financial Resource Strain: Not on file  Food Insecurity: Not on file  Transportation Needs: Not on file  Physical Activity: Not on file  Stress: Not on file  Social Connections: Not on file  Intimate Partner Violence: Not on file    Review of Systems 13 point review of systems per patient health survey noted.  Negative other than as indicated above or in HPI.    Objective:   Vitals:   12/23/20 1402  BP: 122/74  Pulse: 77  Resp: 16  Temp: 98.1 F (36.7 C)  TempSrc: Temporal  SpO2: 96%  Weight: 200 lb 3.2 oz (90.8 kg)  Height: 6' (1.829 m)     Physical Exam Vitals reviewed.  Constitutional:      Appearance: He is well-developed.  HENT:     Head: Normocephalic and atraumatic.     Right Ear: External ear normal.     Left Ear: External ear normal.  Eyes:     Conjunctiva/sclera: Conjunctivae normal.     Pupils: Pupils are equal, round, and reactive to light.  Neck:     Thyroid: No thyromegaly.   Cardiovascular:     Rate and Rhythm: Normal rate and regular rhythm.     Heart sounds: Normal heart sounds.  Pulmonary:     Effort: Pulmonary effort is normal. No respiratory distress.     Breath sounds: Normal breath sounds. No wheezing.  Abdominal:     General: There is no distension.     Palpations: Abdomen is soft.     Tenderness: There is no abdominal tenderness.  Musculoskeletal:        General: No tenderness. Normal range of motion.     Cervical back: Normal range of motion and neck supple.  Lymphadenopathy:  Cervical: No cervical adenopathy.  Skin:    General: Skin is warm and dry.  Neurological:     Mental Status: He is alert and oriented to person, place, and time.     Deep Tendon Reflexes: Reflexes are normal and symmetric.  Psychiatric:        Behavior: Behavior normal.       Assessment & Plan:  Vincent Case is a 55 y.o. male . Annual physical exam  - -anticipatory guidance as below in AVS, screening labs above. Health maintenance items as above in HPI discussed/recommended as applicable.   Hyperlipidemia, unspecified hyperlipidemia type - Plan: rosuvastatin (CRESTOR) 20 MG tablet  -History of early CAD/MI and some increase in LDL recently, I do agree with increase of Crestor to 20 mg daily with potential side effects discussed.  New prescription provided.  Goal LDL less than 70 per note from cardiology.  Keep follow-up as planned to discuss aspirin.  Need for shingles vaccine - Plan: Varicella-zoster vaccine IM  History of atypical nevus - Plan: Ambulatory referral to Dermatology  -Unknown specifics but reportedly atypical nevus removed from abdomen in the past when he was living in [place].  New referral placed for local dermatologist for ongoing care.  Primary hypertension  -Stable with current regimen, no changes.  Meds ordered this encounter  Medications   rosuvastatin (CRESTOR) 20 MG tablet    Sig: Take 1 tablet (20 mg total) by mouth daily.     Dispense:  90 tablet    Refill:  1   Patient Instructions  I would consider taking the 20gm dose of Crestor as recommended by cardiology.  I will refer you to dermatology.  Follow up with eye care provider when able.  Exercise goal of 150 mins per week spread out over most days.  Thanks for coming in today.   Preventive Care 11-14 Years Old, Male Preventive care refers to lifestyle choices and visits with your health care provider that can promote health and wellness. Preventive care visits are also called wellness exams. What can I expect for my preventive care visit? Counseling During your preventive care visit, your health care provider may ask about your: Medical history, including: Past medical problems. Family medical history. Current health, including: Emotional well-being. Home life and relationship well-being. Sexual activity. Lifestyle, including: Alcohol, nicotine or tobacco, and drug use. Access to firearms. Diet, exercise, and sleep habits. Safety issues such as seatbelt and bike helmet use. Sunscreen use. Work and work Astronomer. Physical exam Your health care provider will check your: Height and weight. These may be used to calculate your BMI (body mass index). BMI is a measurement that tells if you are at a healthy weight. Waist circumference. This measures the distance around your waistline. This measurement also tells if you are at a healthy weight and may help predict your risk of certain diseases, such as type 2 diabetes and high blood pressure. Heart rate and blood pressure. Body temperature. Skin for abnormal spots. What immunizations do I need? Vaccines are usually given at various ages, according to a schedule. Your health care provider will recommend vaccines for you based on your age, medical history, and lifestyle or other factors, such as travel or where you work. What tests do I need? Screening Your health care provider may recommend screening  tests for certain conditions. This may include: Lipid and cholesterol levels. Diabetes screening. This is done by checking your blood sugar (glucose) after you have not eaten for a while (fasting). Hepatitis  B test. Hepatitis C test. HIV (human immunodeficiency virus) test. STI (sexually transmitted infection) testing, if you are at risk. Lung cancer screening. Prostate cancer screening. Colorectal cancer screening. Talk with your health care provider about your test results, treatment options, and if necessary, the need for more tests. Follow these instructions at home: Eating and drinking  Eat a diet that includes fresh fruits and vegetables, whole grains, lean protein, and low-fat dairy products. Take vitamin and mineral supplements as recommended by your health care provider. Do not drink alcohol if your health care provider tells you not to drink. If you drink alcohol: Limit how much you have to 0-2 drinks a day. Know how much alcohol is in your drink. In the [place] one drink equals one 12 oz bottle of beer (355 mL), one 5 oz glass of wine (148 mL), or one 1 oz glass of hard liquor (44 mL). Lifestyle Brush your teeth every morning and night with fluoride toothpaste. Floss one time each day. Exercise for at least 30 minutes 5 or more days each week. Do not use any products that contain nicotine or tobacco. These products include cigarettes, chewing tobacco, and vaping devices, such as e-cigarettes. If you need help quitting, ask your health care provider. Do not use drugs. If you are sexually active, practice safe sex. Use a condom or other form of protection to prevent STIs. Take aspirin only as told by your health care provider. Make sure that you understand how much to take and what form to take. Work with your health care provider to find out whether it is safe and beneficial for you to take aspirin daily. Find healthy ways to manage stress, such as: Meditation, yoga, or listening  to music. Journaling. Talking to a trusted person. Spending time with friends and family. Minimize exposure to UV radiation to reduce your risk of skin cancer. Safety Always wear your seat belt while driving or riding in a vehicle. Do not drive: If you have been drinking alcohol. Do not ride with someone who has been drinking. When you are tired or distracted. While texting. If you have been using any mind-altering substances or drugs. Wear a helmet and other protective equipment during sports activities. If you have firearms in your house, make sure you follow all gun safety procedures. What's next? Go to your health care provider once a year for an annual wellness visit. Ask your health care provider how often you should have your eyes and teeth checked. Stay up to date on all vaccines. This information is not intended to replace advice given to you by your health care provider. Make sure you discuss any questions you have with your health care provider. Document Revised: 07/24/2020 Document Reviewed: 07/24/2020 Elsevier Patient Education  2022 [employer]    Signed,   Meredith Staggers, MD  Primary Care, [facility] 12/23/20 3:12 PM

## 2020-12-23 NOTE — Patient Instructions (Addendum)
I would consider taking the 20gm dose of Crestor as recommended by cardiology.  I will refer you to dermatology.  Follow up with eye care provider when able.  Exercise goal of 150 mins per week spread out over most days.  Thanks for coming in today.   Preventive Care 26-55 Years Old, Male Preventive care refers to lifestyle choices and visits with your health care provider that can promote health and wellness. Preventive care visits are also called wellness exams. What can I expect for my preventive care visit? Counseling During your preventive care visit, your health care provider may ask about your: Medical history, including: Past medical problems. Family medical history. Current health, including: Emotional well-being. Home life and relationship well-being. Sexual activity. Lifestyle, including: Alcohol, nicotine or tobacco, and drug use. Access to firearms. Diet, exercise, and sleep habits. Safety issues such as seatbelt and bike helmet use. Sunscreen use. Work and work Astronomer. Physical exam Your health care provider will check your: Height and weight. These may be used to calculate your BMI (body mass index). BMI is a measurement that tells if you are at a healthy weight. Waist circumference. This measures the distance around your waistline. This measurement also tells if you are at a healthy weight and may help predict your risk of certain diseases, such as type 2 diabetes and high blood pressure. Heart rate and blood pressure. Body temperature. Skin for abnormal spots. What immunizations do I need? Vaccines are usually given at various ages, according to a schedule. Your health care provider will recommend vaccines for you based on your age, medical history, and lifestyle or other factors, such as travel or where you work. What tests do I need? Screening Your health care provider may recommend screening tests for certain conditions. This may include: Lipid and  cholesterol levels. Diabetes screening. This is done by checking your blood sugar (glucose) after you have not eaten for a while (fasting). Hepatitis B test. Hepatitis C test. HIV (human immunodeficiency virus) test. STI (sexually transmitted infection) testing, if you are at risk. Lung cancer screening. Prostate cancer screening. Colorectal cancer screening. Talk with your health care provider about your test results, treatment options, and if necessary, the need for more tests. Follow these instructions at home: Eating and drinking  Eat a diet that includes fresh fruits and vegetables, whole grains, lean protein, and low-fat dairy products. Take vitamin and mineral supplements as recommended by your health care provider. Do not drink alcohol if your health care provider tells you not to drink. If you drink alcohol: Limit how much you have to 0-2 drinks a day. Know how much alcohol is in your drink. In the [place] one drink equals one 12 oz bottle of beer (355 mL), one 5 oz glass of wine (148 mL), or one 1 oz glass of hard liquor (44 mL). Lifestyle Brush your teeth every morning and night with fluoride toothpaste. Floss one time each day. Exercise for at least 30 minutes 5 or more days each week. Do not use any products that contain nicotine or tobacco. These products include cigarettes, chewing tobacco, and vaping devices, such as e-cigarettes. If you need help quitting, ask your health care provider. Do not use drugs. If you are sexually active, practice safe sex. Use a condom or other form of protection to prevent STIs. Take aspirin only as told by your health care provider. Make sure that you understand how much to take and what form to take. Work with your health care  provider to find out whether it is safe and beneficial for you to take aspirin daily. Find healthy ways to manage stress, such as: Meditation, yoga, or listening to music. Journaling. Talking to a trusted  person. Spending time with friends and family. Minimize exposure to UV radiation to reduce your risk of skin cancer. Safety Always wear your seat belt while driving or riding in a vehicle. Do not drive: If you have been drinking alcohol. Do not ride with someone who has been drinking. When you are tired or distracted. While texting. If you have been using any mind-altering substances or drugs. Wear a helmet and other protective equipment during sports activities. If you have firearms in your house, make sure you follow all gun safety procedures. What's next? Go to your health care provider once a year for an annual wellness visit. Ask your health care provider how often you should have your eyes and teeth checked. Stay up to date on all vaccines. This information is not intended to replace advice given to you by your health care provider. Make sure you discuss any questions you have with your health care provider. Document Revised: 07/24/2020 Document Reviewed: 07/24/2020 Elsevier Patient Education  2022 [employer].

## 2021-01-20 ENCOUNTER — Ambulatory Visit: Payer: 59

## 2021-01-27 ENCOUNTER — Other Ambulatory Visit: Payer: Self-pay

## 2021-01-27 ENCOUNTER — Encounter (HOSPITAL_BASED_OUTPATIENT_CLINIC_OR_DEPARTMENT_OTHER): Payer: Self-pay | Admitting: Cardiology

## 2021-01-27 ENCOUNTER — Ambulatory Visit (INDEPENDENT_AMBULATORY_CARE_PROVIDER_SITE_OTHER): Payer: 59 | Admitting: Cardiology

## 2021-01-27 VITALS — BP 126/84 | HR 86 | Ht 72.0 in | Wt 199.0 lb

## 2021-01-27 DIAGNOSIS — Z712 Person consulting for explanation of examination or test findings: Secondary | ICD-10-CM | POA: Diagnosis not present

## 2021-01-27 DIAGNOSIS — Z8249 Family history of ischemic heart disease and other diseases of the circulatory system: Secondary | ICD-10-CM | POA: Diagnosis not present

## 2021-01-27 DIAGNOSIS — Z7189 Other specified counseling: Secondary | ICD-10-CM

## 2021-01-27 DIAGNOSIS — I2510 Atherosclerotic heart disease of native coronary artery without angina pectoris: Secondary | ICD-10-CM | POA: Diagnosis not present

## 2021-01-27 DIAGNOSIS — I10 Essential (primary) hypertension: Secondary | ICD-10-CM

## 2021-01-27 DIAGNOSIS — E785 Hyperlipidemia, unspecified: Secondary | ICD-10-CM

## 2021-01-27 NOTE — Patient Instructions (Signed)

## 2021-01-27 NOTE — Progress Notes (Signed)
Cardiology Office Note:    Date:  01/27/2021   ID:  Vincent Case, DOB 01-19-1966, MRN [id number]  PCP:  Wendie Agreste, MD  Cardiologist:  Buford Dresser, MD  Referring MD: Wendie Agreste, MD   CC: follow up  History of Present Illness:    Vincent Case is a 55 y.o. male with a hx of hypertension, hyperlipidemia, family history of CAD who is seen for follow up today. I initially met him 10/23/20 as a new consult at the request of Wendie Agreste, MD for the evaluation and management of chest pain.  Note from 10/04/20 with Dr. Carlota Raspberry reviewed. Had noted atypical chest pain in July, resolved at that visit.  Today: He is doing well and has no major cardiovascular concerns. He continues to have back pain that bothers him.  He has not been active because of the holidays. He intends to get back to regular exercise after the holidays.  At home, his blood pressure is usually 536U systolic. However, he often checks his blood pressure after running around the house. He notices his blood pressure is less elevated if he is able to rest before recording it.   He tolerates the Crestor. He used to take aspirin for many years before being told to discontinue and he has not taken aspirin since. His recent LDL increased to 113 (see scanned labs), and since that time his rosuvastatin was increased to 20 mg daily.  Denies chest pain, shortness of breath at rest or with normal exertion. No PND, orthopnea, LE edema or unexpected weight gain. No syncope or palpitations.   Past Medical History:  Diagnosis Date   Hyperlipidemia    Hypertension     Past Surgical History:  Procedure Laterality Date   FRACTURE SURGERY  1984    Current Medications: Current Outpatient Medications on File Prior to Visit  Medication Sig   acyclovir (ZOVIRAX) 400 MG tablet Take 1 tablet (400 mg total) by mouth daily.   amLODipine-benazepril (LOTREL) 5-20 MG capsule Take 1 capsule by mouth daily.    Multiple Vitamins-Minerals (MENS MULTIVITAMIN PO) Take by mouth.   rosuvastatin (CRESTOR) 20 MG tablet Take 1 tablet (20 mg total) by mouth daily.   No current facility-administered medications on file prior to visit.     Allergies:   Patient has no known allergies.   Social History   Tobacco Use   Smoking status: Never   Smokeless tobacco: Never  Vaping Use   Vaping Use: Never used  Substance Use Topics   Alcohol use: Yes   Drug use: Never    Family History: family history includes Cancer in his maternal grandmother; Early death in his father.  ROS:   Please see the history of present illness.   (+) Back pain Additional pertinent ROS:  EKGs/Labs/Other Studies Reviewed:    The following studies were reviewed today: Coronary Morph 10/31/20 1. Coronary calcium score of 16. This was 34 percentile for age and sex matched control. 2. Normal coronary origin with LEFT dominance. 3. There is ostial left main calcified plaque, 0-10% stenosis, non flow limiting. CAD-RADS 1. Minimal non-obstructive CAD (0-24%). Consider non-atherosclerotic causes of chest pain. Consider preventive therapy and risk factor modification. No significant incidental findings.  Echo 2019 scanned in our system, EF 60%, otherwise unremarkable.  Reports multiple prior treadmill stress tests being good.  EKG:  EKG is personally reviewed.   10/23/20 NSR at 73 bpm  Recent Labs: 10/04/2020: ALT 28 10/30/2020: BUN 13; Creatinine, Ser 1.15;  Potassium 4.9; Sodium 142  Recent Lipid Panel    Component Value Date/Time   CHOL 180 10/04/2020 1128   TRIG 147.0 10/04/2020 1128   HDL 60.30 10/04/2020 1128   CHOLHDL 3 10/04/2020 1128   VLDL 29.4 10/04/2020 1128   LDLCALC 90 10/04/2020 1128    Physical Exam:    VS:  BP 126/84    Pulse 86    Ht 6' (1.829 m)    Wt 199 lb (90.3 kg)    SpO2 95%    BMI 26.99 kg/m     Wt Readings from Last 3 Encounters:  01/27/21 199 lb (90.3 kg)  12/23/20 200 lb 3.2 oz (90.8  kg)  10/23/20 194 lb (88 kg)    GEN: Well nourished, well developed in no acute distress HEENT: Normal, moist mucous membranes NECK: No JVD CARDIAC: regular rhythm, normal S1 and S2, no rubs or gallops. No murmur. VASCULAR: Radial and DP pulses 2+ bilaterally. No carotid bruits RESPIRATORY:  Clear to auscultation without rales, wheezing or rhonchi  ABDOMEN: Soft, non-tender, non-distended MUSCULOSKELETAL:  Ambulates independently SKIN: Warm and dry, no edema NEUROLOGIC:  Alert and oriented x 3. No focal neuro deficits noted. PSYCHIATRIC:  Normal affect    ASSESSMENT:    1. Essential hypertension   2. Family history of heart disease   3. Encounter to discuss test results   4. Hyperlipidemia, unspecified hyperlipidemia type   5. Cardiac risk counseling     PLAN:    Chest pain, resolved Family history of premature CAD in father (died of MI in his 88s) Nonobstructive CAD on CT -we reviewed the results of his CT coronary -we discussed statin -we discussed aspirin today -discussed red flag warning signs   Hypertension: at goal today -continue amlodipine-benazepril  Hypcholesterolemia -LDL 90->113, since that test his rosuvastatin was increased to 20 mg daily -has follow up with his PCP. If LDL not at goal <70, would either add ezetimibe or consider trying atorvastatin (less potent but may have better response if he is not responding to rosuvastatin)  Cardiac risk counseling and prevention recommendations: -recommend heart healthy/Mediterranean diet, with whole grains, fruits, vegetable, fish, lean meats, nuts, and olive oil. Limit salt. -recommend moderate walking, 3-5 times/week for 30-50 minutes each session. Aim for at least 150 minutes.week. Goal should be pace of 3 miles/hours, or walking 1.5 miles in 30 minutes -recommend avoidance of tobacco products. Avoid excess alcohol.  Plan for follow up: 2 years  Buford Dresser, MD, PhD, [facility]     Medication Adjustments/Labs and Tests Ordered: Current medicines are reviewed at length with the patient today.  Concerns regarding medicines are outlined above.  No orders of the defined types were placed in this encounter.   No orders of the defined types were placed in this encounter.    Patient Instructions  Medication Instructions:  Your Physician recommend you continue on your current medication as directed.    *If you need a refill on your cardiac medications before your next appointment, please call your pharmacy*   Lab Work: None ordered today   Testing/Procedures: None ordered today   Follow-Up: At [facility], you and your health needs are our priority.  As part of our continuing mission to provide you with exceptional heart care, we have created designated Provider Care Teams.  These Care Teams include your primary Cardiologist (physician) and Advanced Practice Providers (APPs -  Physician Assistants and Nurse Practitioners) who all work together to provide  you with the care you need, when you need it.  We recommend signing up for the patient portal called "MyChart".  Sign up information is provided on this After Visit Summary.  MyChart is used to connect with patients for Virtual Visits (Telemedicine).  Patients are able to view lab/test results, encounter notes, upcoming appointments, etc.  Non-urgent messages can be sent to your provider as well.   To learn more about what you can do with MyChart, go to [email or web address].    Your next appointment:   2 year(s)  The format for your next appointment:   In Person  Provider:   Buford Dresser, MD       Wilhemina Bonito as a scribe for Buford Dresser, MD.,have documented all relevant documentation on the behalf of Buford Dresser, MD,as directed by  Buford Dresser, MD while in the presence of Buford Dresser, MD.  I, Buford Dresser, MD, have reviewed all  documentation for this visit. The documentation on 01/27/21 for the exam, diagnosis, procedures, and orders are all accurate and complete.   Signed, Buford Dresser, MD PhD 01/27/2021 9:47 AM    [place]

## 2021-02-26 ENCOUNTER — Encounter: Payer: Self-pay | Admitting: Family Medicine

## 2021-02-26 ENCOUNTER — Ambulatory Visit (INDEPENDENT_AMBULATORY_CARE_PROVIDER_SITE_OTHER): Payer: 59 | Admitting: Family Medicine

## 2021-02-26 ENCOUNTER — Ambulatory Visit: Payer: 59 | Admitting: Family Medicine

## 2021-02-26 VITALS — BP 128/72 | HR 81 | Temp 98.1°F | Resp 16 | Ht 72.0 in | Wt 203.0 lb

## 2021-02-26 DIAGNOSIS — R1031 Right lower quadrant pain: Secondary | ICD-10-CM | POA: Diagnosis not present

## 2021-02-26 LAB — POCT URINALYSIS DIP (MANUAL ENTRY)
Bilirubin, UA: NEGATIVE
Blood, UA: NEGATIVE
Glucose, UA: NEGATIVE mg/dL
Ketones, POC UA: NEGATIVE mg/dL
Leukocytes, UA: NEGATIVE
Nitrite, UA: NEGATIVE
Protein Ur, POC: NEGATIVE mg/dL
Spec Grav, UA: 1.01 (ref 1.010–1.025)
Urobilinogen, UA: 0.2 E.U./dL
pH, UA: 5 (ref 5.0–8.0)

## 2021-02-26 LAB — CBC
HCT: 43.7 % (ref 39.0–52.0)
Hemoglobin: 14.9 g/dL (ref 13.0–17.0)
MCHC: 34.1 g/dL (ref 30.0–36.0)
MCV: 93.3 fl (ref 78.0–100.0)
Platelets: 166 10*3/uL (ref 150.0–400.0)
RBC: 4.69 Mil/uL (ref 4.22–5.81)
RDW: 13.2 % (ref 11.5–15.5)
WBC: 6.4 10*3/uL (ref 4.0–10.5)

## 2021-02-26 NOTE — Patient Instructions (Signed)
I will check the blood count and urine test.  Depending on his results, neck step would possibly be CT scan.  We will let you know in the next few days.  If any acute worsening pain or new symptoms be seen right away as we discussed.  Return to the clinic or go to the nearest emergency room if any of your symptoms worsen or new symptoms occur.  Abdominal Pain, Adult Pain in the abdomen (abdominal pain) can be caused by many things. Often, abdominal pain is not serious and it gets better with no treatment or by being treated at home. However, sometimes abdominal pain is serious. Your health care provider will ask questions about your medical history and do a physical exam to try to determine the cause of your abdominal pain. Follow these instructions at home: Medicines Take over-the-counter and prescription medicines only as told by your health care provider. Do not take a laxative unless told by your health care provider. General instructions  Watch your condition for any changes. Drink enough fluid to keep your urine pale yellow. Keep all follow-up visits as told by your health care provider. This is important. Contact a health care provider if: Your abdominal pain changes or gets worse. You are not hungry or you lose weight without trying. You are constipated or have diarrhea for more than 2-3 days. You have pain when you urinate or have a bowel movement. Your abdominal pain wakes you up at night. Your pain gets worse with meals, after eating, or with certain foods. You are vomiting and cannot keep anything down. You have a fever. You have blood in your urine. Get help right away if: Your pain does not go away as soon as your health care provider told you to expect. You cannot stop vomiting. Your pain is only in areas of the abdomen, such as the right side or the left lower portion of the abdomen. Pain on the right side could be caused by appendicitis. You have bloody or black stools,  or stools that look like tar. You have severe pain, cramping, or bloating in your abdomen. You have signs of dehydration, such as: Dark urine, very little urine, or no urine. Cracked lips. Dry mouth. Sunken eyes. Sleepiness. Weakness. You have trouble breathing or chest pain. Summary Often, abdominal pain is not serious and it gets better with no treatment or by being treated at home. However, sometimes abdominal pain is serious. Watch your condition for any changes. Take over-the-counter and prescription medicines only as told by your health care provider. Contact a health care provider if your abdominal pain changes or gets worse. Get help right away if you have severe pain, cramping, or bloating in your abdomen. This information is not intended to replace advice given to you by your health care provider. Make sure you discuss any questions you have with your health care provider. Document Revised: 03/17/2019 Document Reviewed: 06/06/2018 Elsevier Patient Education  2022 [employer].

## 2021-02-26 NOTE — Progress Notes (Signed)
Subjective:  Patient ID: Vincent Case, male    DOB: Feb 19, 1965  Age: 56 y.o. MRN: [id number]  CC:  Chief Complaint  Patient presents with   Abdominal Pain    Pt reports LR abdominal pain starting about 2 weeks ago, notes worse after sitting, no noted connection to Bms or food intake     HPI Vincent Case presents for   Abdominal Pain: Past 2 weeks or more -  about the same,  sometimes a little worse.  R lower abdomen. Toward R groin at times, no testicular pain.  Noticed with walking. Had not been eating well that day - junk food, frozen food, party foods. Champagne and few glasses wine night prior.  NKI.  No n/v/d or fever. No melena, hematochezia. BM QD, normal.  Tried stopping Crestor for few days - no change.  Comes and goes. Lasts few mins, several times per day.  No heartburn, eating and drinking normally.  No hematuria/ dysuria/urgency/frequency and no penile d/c. No new sexual contacts. No hx of kidney stones.  Always RLQ. ? May be worse with sitting longer.  No hx of hernia. No swelling. No change with lifting/physical activity.  No change with food.  No abd surgeries.    History Patient Active Problem List   Diagnosis Date Noted   Hypertension 12/08/2019   Hyperlipidemia 12/08/2019   Family history of early CAD 12/08/2019   Past Medical History:  Diagnosis Date   Hyperlipidemia    Hypertension    Past Surgical History:  Procedure Laterality Date   FRACTURE SURGERY  1984   No Known Allergies Prior to Admission medications   Medication Sig Start Date End Date Taking? Authorizing Provider  acyclovir (ZOVIRAX) 400 MG tablet Take 1 tablet (400 mg total) by mouth daily. 07/19/20  Yes Wendie Agreste, MD  amLODipine-benazepril (LOTREL) 5-20 MG capsule Take 1 capsule by mouth daily. 07/19/20  Yes Wendie Agreste, MD  Multiple Vitamins-Minerals (MENS MULTIVITAMIN PO) Take by mouth.   Yes [provider]  rosuvastatin (CRESTOR) 20 MG tablet Take 1  tablet (20 mg total) by mouth daily. 12/23/20  Yes Wendie Agreste, MD   Social History   Socioeconomic History   Marital status: Married    Spouse name: Not on file   Number of children: Not on file   Years of education: Not on file   Highest education level: Not on file  Occupational History   Not on file  Tobacco Use   Smoking status: Never   Smokeless tobacco: Never  Vaping Use   Vaping Use: Never used  Substance and Sexual Activity   Alcohol use: Yes   Drug use: Never   Sexual activity: Yes    Birth control/protection: None  Other Topics Concern   Not on file  Social History Narrative   Not on file   Social Determinants of Health   Financial Resource Strain: Not on file  Food Insecurity: Not on file  Transportation Needs: Not on file  Physical Activity: Not on file  Stress: Not on file  Social Connections: Not on file  Intimate Partner Violence: Not on file    Review of Systems Per HPI.   Objective:   Vitals:   02/26/21 1302  BP: 128/72  Pulse: 81  Resp: 16  Temp: 98.1 F (36.7 C)  TempSrc: Temporal  SpO2: 96%  Weight: 203 lb (92.1 kg)  Height: 6' (1.829 m)    Physical Exam Vitals reviewed.  Constitutional:  Appearance: He is well-developed.  HENT:     Head: Normocephalic and atraumatic.  Neck:     Vascular: No carotid bruit or JVD.  Cardiovascular:     Rate and Rhythm: Normal rate and regular rhythm.     Heart sounds: Normal heart sounds. No murmur heard. Pulmonary:     Effort: Pulmonary effort is normal.     Breath sounds: Normal breath sounds. No rales.  Abdominal:     General: Abdomen is flat. There is no distension. There are no signs of injury.     Palpations: Abdomen is soft.     Tenderness: There is abdominal tenderness (Slight tenderness with deep palpation at the right lower quadrant, McBurney's point.  Skin intact without erythema, no bulge, no defect appreciated including with Valsalva/cough.  No hernias appreciated.).  There is no right CVA tenderness, left CVA tenderness, guarding or rebound. Positive signs include McBurney's sign.     Hernia: No hernia is present. There is no hernia in the umbilical area, ventral area, left inguinal area, right femoral area, left femoral area or right inguinal area.  Musculoskeletal:     Right lower leg: No edema.     Left lower leg: No edema.  Skin:    General: Skin is warm and dry.  Neurological:     Mental Status: He is alert and oriented to person, place, and time.  Psychiatric:        Mood and Affect: Mood normal.    Assessment & Plan:  Vincent Case is a 56 y.o. male . Abdominal pain, right lower quadrant - Plan: CBC, POCT urinalysis dipstick  -Intermittent right lower quadrant abdominal pain lasting few minutes at a time over the past 2 weeks.  Some episodes where pain has worsened but still no nausea, vomiting, fever, concerning symptoms as above.  Minimal discomfort on exam.  Check CBC, urinalysis to evaluate for nephrolith although less likely.  Consider CT versus ultrasound imaging depending on results above.  RTC/ER precautions given with handout  No orders of the defined types were placed in this encounter.  Patient Instructions  I will check the blood count and urine test.  Depending on his results, neck step would possibly be CT scan.  We will let you know in the next few days.  If any acute worsening pain or new symptoms be seen right away as we discussed.  Return to the clinic or go to the nearest emergency room if any of your symptoms worsen or new symptoms occur.  Abdominal Pain, Adult Pain in the abdomen (abdominal pain) can be caused by many things. Often, abdominal pain is not serious and it gets better with no treatment or by being treated at home. However, sometimes abdominal pain is serious. Your health care provider will ask questions about your medical history and do a physical exam to try to determine the cause of your abdominal  pain. Follow these instructions at home: Medicines Take over-the-counter and prescription medicines only as told by your health care provider. Do not take a laxative unless told by your health care provider. General instructions  Watch your condition for any changes. Drink enough fluid to keep your urine pale yellow. Keep all follow-up visits as told by your health care provider. This is important. Contact a health care provider if: Your abdominal pain changes or gets worse. You are not hungry or you lose weight without trying. You are constipated or have diarrhea for more than 2-3 days. You have pain when  you urinate or have a bowel movement. Your abdominal pain wakes you up at night. Your pain gets worse with meals, after eating, or with certain foods. You are vomiting and cannot keep anything down. You have a fever. You have blood in your urine. Get help right away if: Your pain does not go away as soon as your health care provider told you to expect. You cannot stop vomiting. Your pain is only in areas of the abdomen, such as the right side or the left lower portion of the abdomen. Pain on the right side could be caused by appendicitis. You have bloody or black stools, or stools that look like tar. You have severe pain, cramping, or bloating in your abdomen. You have signs of dehydration, such as: Dark urine, very little urine, or no urine. Cracked lips. Dry mouth. Sunken eyes. Sleepiness. Weakness. You have trouble breathing or chest pain. Summary Often, abdominal pain is not serious and it gets better with no treatment or by being treated at home. However, sometimes abdominal pain is serious. Watch your condition for any changes. Take over-the-counter and prescription medicines only as told by your health care provider. Contact a health care provider if your abdominal pain changes or gets worse. Get help right away if you have severe pain, cramping, or bloating in your  abdomen. This information is not intended to replace advice given to you by your health care provider. Make sure you discuss any questions you have with your health care provider. Document Revised: 03/17/2019 Document Reviewed: 06/06/2018 Elsevier Patient Education  2022 [place],   Merri Ray, MD [place] 02/26/21 1:52 PM

## 2021-03-04 ENCOUNTER — Encounter: Payer: Self-pay | Admitting: Family Medicine

## 2021-03-04 DIAGNOSIS — R1031 Right lower quadrant pain: Secondary | ICD-10-CM

## 2021-03-04 NOTE — Telephone Encounter (Signed)
Pt giving you update on his sxs per your request on lab result note

## 2021-03-20 ENCOUNTER — Ambulatory Visit
Admission: RE | Admit: 2021-03-20 | Discharge: 2021-03-20 | Disposition: A | Discharge status: Home / Self Care | Payer: 59 | Source: Ambulatory Visit | Attending: Family Medicine | Admitting: Family Medicine

## 2021-03-20 ENCOUNTER — Other Ambulatory Visit: Payer: Self-pay

## 2021-03-20 DIAGNOSIS — R1031 Right lower quadrant pain: Secondary | ICD-10-CM

## 2021-03-27 ENCOUNTER — Ambulatory Visit (INDEPENDENT_AMBULATORY_CARE_PROVIDER_SITE_OTHER): Payer: 59 | Admitting: Family Medicine

## 2021-03-27 ENCOUNTER — Encounter: Payer: Self-pay | Admitting: Family Medicine

## 2021-03-27 VITALS — BP 124/84 | HR 90 | Temp 98.1°F | Resp 16 | Ht 72.0 in | Wt 197.6 lb

## 2021-03-27 DIAGNOSIS — M545 Low back pain, unspecified: Secondary | ICD-10-CM | POA: Diagnosis not present

## 2021-03-27 DIAGNOSIS — N281 Cyst of kidney, acquired: Secondary | ICD-10-CM

## 2021-03-27 DIAGNOSIS — R1031 Right lower quadrant pain: Secondary | ICD-10-CM | POA: Diagnosis not present

## 2021-03-27 DIAGNOSIS — N200 Calculus of kidney: Secondary | ICD-10-CM

## 2021-03-27 DIAGNOSIS — N2889 Other specified disorders of kidney and ureter: Secondary | ICD-10-CM

## 2021-03-27 NOTE — Progress Notes (Signed)
Subjective:  Patient ID: Vincent Case, male    DOB: [birth year]  Age: 56 y.o. MRN: [id number]  CC:  Chief Complaint  Patient presents with   Results    Here to go over ultrasound results of the abdominal area     HPI Vincent Case presents for   RLQ abdominal pain: Discussed January 18.  Intermittent less than a few minutes at a time, has been present for a few weeks at that time.  CBC and urinalysis were reassuring.  Ultrasound on February 9 negative for gallstones.  Possible small left kidney stone.  Echogenic liver consistent with hepatic steatosis.  There was a cyst at the midpole of the right kidney noted.  4.9 x 4.4 x 4.9 cm. Still some pain at times each day - comes and goes. R groin, no fever, n/v/hematuria, urgency/dysuria.  No swelling. Some back issues in past. Some low back pain past few weeks to months. On and off for years.  Same level as groin.  No bowel or bladder incontinence, no saddle anesthesia, no lower extremity weakness. No lower leg pain radiation.  Some heating pad for back at times.         History Patient Active Problem List   Diagnosis Date Noted   Hypertension 12/08/2019   Hyperlipidemia 12/08/2019   Family history of early CAD 12/08/2019   Past Medical History:  Diagnosis Date   Hyperlipidemia    Hypertension    Past Surgical History:  Procedure Laterality Date   FRACTURE SURGERY  1984   No Known Allergies Prior to Admission medications   Medication Sig Start Date End Date Taking? Authorizing Provider  acyclovir (ZOVIRAX) 400 MG tablet Take 1 tablet (400 mg total) by mouth daily. 07/19/20  Yes Wendie Agreste, MD  amLODipine-benazepril (LOTREL) 5-20 MG capsule Take 1 capsule by mouth daily. 07/19/20  Yes Wendie Agreste, MD  Multiple Vitamins-Minerals (MENS MULTIVITAMIN PO) Take by mouth.   Yes [provider]  rosuvastatin (CRESTOR) 20 MG tablet Take 1 tablet (20 mg total) by mouth daily. 12/23/20  Yes Wendie Agreste,  MD   Social History   Socioeconomic History   Marital status: Married    Spouse name: Not on file   Number of children: Not on file   Years of education: Not on file   Highest education level: Not on file  Occupational History   Not on file  Tobacco Use   Smoking status: Never   Smokeless tobacco: Never  Vaping Use   Vaping Use: Never used  Substance and Sexual Activity   Alcohol use: Yes   Drug use: Never   Sexual activity: Yes    Birth control/protection: None  Other Topics Concern   Not on file  Social History Narrative   Not on file   Social Determinants of Health   Financial Resource Strain: Not on file  Food Insecurity: Not on file  Transportation Needs: Not on file  Physical Activity: Not on file  Stress: Not on file  Social Connections: Not on file  Intimate Partner Violence: Not on file    Review of Systems Per HPI.   Objective:   Vitals:   03/27/21 0906  BP: 124/84  Pulse: 90  Resp: 16  Temp: 98.1 F (36.7 C)  TempSrc: Temporal  SpO2: 96%  Weight: 197 lb 9.6 oz (89.6 kg)  Height: 6' (1.829 m)     Physical Exam Vitals reviewed.  Constitutional:      General: He  is not in acute distress.    Appearance: Normal appearance. He is well-developed.  HENT:     Head: Normocephalic and atraumatic.  Cardiovascular:     Rate and Rhythm: Normal rate.  Pulmonary:     Effort: Pulmonary effort is normal.  Abdominal:     General: Abdomen is flat. Bowel sounds are normal. There is no distension.     Palpations: Abdomen is soft. There is no mass.     Tenderness: There is abdominal tenderness (Slight discomfort at the right lower quadrant, just above the inguinal fold without apparent swelling, no bulge noted with Valsalva/cough.  No hernia appreciated at groin.  Skin intact without erythema or swelling.). There is no right CVA tenderness, left CVA tenderness, guarding or rebound.     Hernia: No hernia is present.  Musculoskeletal:     Comments:  Ambulating without difficulty or assistive device.  Low back nontender, no focal bony tenderness, no appreciable paraspinal spasm.  SI joints nontender.  Negative seated straight leg raise.  Reflexes 2+ at patella and Achilles bilaterally.  Neurological:     Mental Status: He is alert and oriented to person, place, and time.  Psychiatric:        Mood and Affect: Mood normal.       Assessment & Plan:  Vincent Case is a 56 y.o. male . RLQ abdominal pain - Plan: Ambulatory referral to Urology, CT Abdomen Pelvis W Contrast Renal cyst, right - Plan: Ambulatory referral to Urology Left nephrolithiasis - Plan: Ambulatory referral to Urology Other specified disorders of kidney and ureter - Plan: CT Abdomen Pelvis W Contrast  -New right renal cyst noted on ultrasound.  Unsure this would be causing his symptoms.  Still some discomfort in the groin without apparent concerns on ultrasound.  May need further definition from CT to rule out hernia or mass.  CT ordered to further evaluate cyst and will refer to urology to discuss as well.  RTC precautions given.  Right-sided low back pain without sciatica, unspecified chronicity quadrant abdominal pain - Plan: CT Abdomen Pelvis W Contrast  -Reassuring exam at present.  Tylenol over-the-counter, handout given on back pain.  RTC precautions if persistent.  No orders of the defined types were placed in this encounter.  Patient Instructions  I will refer you to urology to discuss the renal cyst, possible kidney stone as well as the groin pain.  I have also ordered a CT scan to evaluate that area further.  Tylenol if needed for back pain, heat, range of motion and stretches.  See information below.  Please follow-up with back pain persist.  If any new or worsening symptoms please be seen.  Let me know if there are questions and take care.   Chronic Back Pain When back pain lasts longer than 3 months, it is called chronic back pain. The cause of your back  pain may not be known. Some common causes include: Wear and tear (degenerative disease) of the bones, ligaments, or disks in your back. Inflammation and stiffness in your back (arthritis). People who have chronic back pain often go through certain periods in which the pain is more intense (flare-ups). Many people can learn to manage the pain with home care. Follow these instructions at home: Pay attention to any changes in your symptoms. Take these actions to help with your pain: Managing pain and stiffness   If directed, apply ice to the painful area. Your health care provider may recommend applying ice during the  first 24-48 hours after a flare-up begins. To do this: Put ice in a plastic bag. Place a towel between your skin and the bag. Leave the ice on for 20 minutes, 2-3 times per day. If directed, apply heat to the affected area as often as told by your health care provider. Use the heat source that your health care provider recommends, such as a moist heat pack or a heating pad. Place a towel between your skin and the heat source. Leave the heat on for 20-30 minutes. Remove the heat if your skin turns bright red. This is especially important if you are unable to feel pain, heat, or cold. You may have a greater risk of getting burned. Try soaking in a warm tub. Activity  Avoid bending and other activities that make the problem worse. Maintain a proper position when standing or sitting: When standing, keep your upper back and neck straight, with your shoulders pulled back. Avoid slouching. When sitting, keep your back straight and relax your shoulders. Do not round your shoulders or pull them backward. Do not sit or stand in one place for long periods of time. Take brief periods of rest throughout the day. This will reduce your pain. Resting in a lying or standing position is usually better than sitting to rest. When you are resting for longer periods, mix in some mild activity or  stretching between periods of rest. This will help to prevent stiffness and pain. Get regular exercise. Ask your health care provider what activities are safe for you. Do not lift anything that is heavier than 10 lb (4.5 kg), or the limit that you are told, until your health care provider says that it is safe. Always use proper lifting technique, which includes: Bending your knees. Keeping the load close to your body. Avoiding twisting. Sleep on a firm mattress in a comfortable position. Try lying on your side with your knees slightly bent. If you lie on your back, put a pillow under your knees. Medicines Treatment may include medicines for pain and inflammation taken by mouth or applied to the skin, prescription pain medicine, or muscle relaxants. Take over-the-counter and prescription medicines only as told by your health care provider. Ask your health care provider if the medicine prescribed to you: Requires you to avoid driving or using machinery. Can cause constipation. You may need to take these actions to prevent or treat constipation: Drink enough fluid to keep your urine pale yellow. Take over-the-counter or prescription medicines. Eat foods that are high in fiber, such as beans, whole grains, and fresh fruits and vegetables. Limit foods that are high in fat and processed sugars, such as fried or sweet foods. General instructions Do not use any products that contain nicotine or tobacco, such as cigarettes, e-cigarettes, and chewing tobacco. If you need help quitting, ask your health care provider. Keep all follow-up visits as told by your health care provider. This is important. Contact a health care provider if: You have pain that is not relieved with rest or medicine. Your pain gets worse, or you have new pain. You have a high fever. You have rapid weight loss. You have trouble doing your normal activities. Get help right away if: You have weakness or numbness in one or both of  your legs or feet. You have trouble controlling your bladder or your bowels. You have severe back pain and have any of the following: Nausea or vomiting. Pain in your abdomen. Shortness of breath or you faint. Summary  Chronic back pain is back pain that lasts longer than 3 months. When a flare-up begins, apply ice to the painful area for the first 24-48 hours. Apply a moist heat pad or use a heating pad on the painful area as directed by your health care provider. When you are resting for longer periods, mix in some mild activity or stretching between periods of rest. This will help to prevent stiffness and pain. This information is not intended to replace advice given to you by your health care provider. Make sure you discuss any questions you have with your health care provider. Document Revised: 03/08/2019 Document Reviewed: 03/08/2019 Elsevier Patient Education  2022 [place],   Merri Ray, MD [place], [facility] 03/27/21 9:48 AM

## 2021-03-27 NOTE — Patient Instructions (Addendum)
I will refer you to urology to discuss the renal cyst, possible kidney stone as well as the groin pain.  I have also ordered a CT scan to evaluate that area further.  Tylenol if needed for back pain, heat, range of motion and stretches.  See information below.  Please follow-up with back pain persist.  If any new or worsening symptoms please be seen.  Let me know if there are questions and take care.   Chronic Back Pain When back pain lasts longer than 3 months, it is called chronic back pain. The cause of your back pain may not be known. Some common causes include: Wear and tear (degenerative disease) of the bones, ligaments, or disks in your back. Inflammation and stiffness in your back (arthritis). People who have chronic back pain often go through certain periods in which the pain is more intense (flare-ups). Many people can learn to manage the pain with home care. Follow these instructions at home: Pay attention to any changes in your symptoms. Take these actions to help with your pain: Managing pain and stiffness   If directed, apply ice to the painful area. Your health care provider may recommend applying ice during the first 24-48 hours after a flare-up begins. To do this: Put ice in a plastic bag. Place a towel between your skin and the bag. Leave the ice on for 20 minutes, 2-3 times per day. If directed, apply heat to the affected area as often as told by your health care provider. Use the heat source that your health care provider recommends, such as a moist heat pack or a heating pad. Place a towel between your skin and the heat source. Leave the heat on for 20-30 minutes. Remove the heat if your skin turns bright red. This is especially important if you are unable to feel pain, heat, or cold. You may have a greater risk of getting burned. Try soaking in a warm tub. Activity  Avoid bending and other activities that make the problem worse. Maintain a proper position when standing or  sitting: When standing, keep your upper back and neck straight, with your shoulders pulled back. Avoid slouching. When sitting, keep your back straight and relax your shoulders. Do not round your shoulders or pull them backward. Do not sit or stand in one place for long periods of time. Take brief periods of rest throughout the day. This will reduce your pain. Resting in a lying or standing position is usually better than sitting to rest. When you are resting for longer periods, mix in some mild activity or stretching between periods of rest. This will help to prevent stiffness and pain. Get regular exercise. Ask your health care provider what activities are safe for you. Do not lift anything that is heavier than 10 lb (4.5 kg), or the limit that you are told, until your health care provider says that it is safe. Always use proper lifting technique, which includes: Bending your knees. Keeping the load close to your body. Avoiding twisting. Sleep on a firm mattress in a comfortable position. Try lying on your side with your knees slightly bent. If you lie on your back, put a pillow under your knees. Medicines Treatment may include medicines for pain and inflammation taken by mouth or applied to the skin, prescription pain medicine, or muscle relaxants. Take over-the-counter and prescription medicines only as told by your health care provider. Ask your health care provider if the medicine prescribed to you: Requires you to  avoid driving or using machinery. Can cause constipation. You may need to take these actions to prevent or treat constipation: Drink enough fluid to keep your urine pale yellow. Take over-the-counter or prescription medicines. Eat foods that are high in fiber, such as beans, whole grains, and fresh fruits and vegetables. Limit foods that are high in fat and processed sugars, such as fried or sweet foods. General instructions Do not use any products that contain nicotine or  tobacco, such as cigarettes, e-cigarettes, and chewing tobacco. If you need help quitting, ask your health care provider. Keep all follow-up visits as told by your health care provider. This is important. Contact a health care provider if: You have pain that is not relieved with rest or medicine. Your pain gets worse, or you have new pain. You have a high fever. You have rapid weight loss. You have trouble doing your normal activities. Get help right away if: You have weakness or numbness in one or both of your legs or feet. You have trouble controlling your bladder or your bowels. You have severe back pain and have any of the following: Nausea or vomiting. Pain in your abdomen. Shortness of breath or you faint. Summary Chronic back pain is back pain that lasts longer than 3 months. When a flare-up begins, apply ice to the painful area for the first 24-48 hours. Apply a moist heat pad or use a heating pad on the painful area as directed by your health care provider. When you are resting for longer periods, mix in some mild activity or stretching between periods of rest. This will help to prevent stiffness and pain. This information is not intended to replace advice given to you by your health care provider. Make sure you discuss any questions you have with your health care provider. Document Revised: 03/08/2019 Document Reviewed: 03/08/2019 Elsevier Patient Education  2022 [employer].

## 2021-04-08 ENCOUNTER — Ambulatory Visit
Admission: RE | Admit: 2021-04-08 | Discharge: 2021-04-08 | Disposition: A | Discharge status: Home / Self Care | Payer: 59 | Source: Ambulatory Visit | Attending: Family Medicine | Admitting: Family Medicine

## 2021-04-08 DIAGNOSIS — R1031 Right lower quadrant pain: Secondary | ICD-10-CM

## 2021-04-08 DIAGNOSIS — N2889 Other specified disorders of kidney and ureter: Secondary | ICD-10-CM

## 2021-04-08 MED ORDER — IOPAMIDOL (ISOVUE-300) INJECTION 61%
100.0000 mL | Freq: Once | INTRAVENOUS | Status: AC | PRN
  Administered 2021-04-08: 100 mL via INTRAVENOUS

## 2021-06-23 ENCOUNTER — Ambulatory Visit: Payer: 59 | Admitting: Family Medicine

## 2021-06-25 ENCOUNTER — Ambulatory Visit (INDEPENDENT_AMBULATORY_CARE_PROVIDER_SITE_OTHER): Payer: 59 | Admitting: Family Medicine

## 2021-06-25 VITALS — BP 112/78 | HR 83 | Temp 98.0°F | Resp 16 | Ht 72.0 in | Wt 195.0 lb

## 2021-06-25 DIAGNOSIS — I10 Essential (primary) hypertension: Secondary | ICD-10-CM | POA: Diagnosis not present

## 2021-06-25 DIAGNOSIS — N400 Enlarged prostate without lower urinary tract symptoms: Secondary | ICD-10-CM | POA: Diagnosis not present

## 2021-06-25 DIAGNOSIS — M545 Low back pain, unspecified: Secondary | ICD-10-CM

## 2021-06-25 DIAGNOSIS — K4020 Bilateral inguinal hernia, without obstruction or gangrene, not specified as recurrent: Secondary | ICD-10-CM | POA: Diagnosis not present

## 2021-06-25 DIAGNOSIS — R1031 Right lower quadrant pain: Secondary | ICD-10-CM

## 2021-06-25 DIAGNOSIS — E785 Hyperlipidemia, unspecified: Secondary | ICD-10-CM

## 2021-06-25 DIAGNOSIS — N281 Cyst of kidney, acquired: Secondary | ICD-10-CM

## 2021-06-25 LAB — LIPID PANEL
Cholesterol: 151 mg/dL (ref 0–200)
HDL: 56.2 mg/dL (ref >39.00)
NonHDL: 94.99
Total CHOL/HDL Ratio: 3
Triglycerides: 275 mg/dL — ABNORMAL HIGH (ref 0.0–149.0)
VLDL: 55 mg/dL — ABNORMAL HIGH (ref 0.0–40.0)

## 2021-06-25 LAB — COMPREHENSIVE METABOLIC PANEL
ALT: 24 U/L (ref 0–53)
AST: 17 U/L (ref 0–37)
Albumin: 4.7 g/dL (ref 3.5–5.2)
Alkaline Phosphatase: 75 U/L (ref 39–117)
BUN: 13 mg/dL (ref 6–23)
CO2: 29 mEq/L (ref 19–32)
Calcium: 9.6 mg/dL (ref 8.4–10.5)
Chloride: 104 mEq/L (ref 96–112)
Creatinine, Ser: 1.04 mg/dL (ref 0.40–1.50)
GFR: 80.57 mL/min (ref >60.00)
Glucose, Bld: 90 mg/dL (ref 70–99)
Potassium: 4.3 mEq/L (ref 3.5–5.1)
Sodium: 141 mEq/L (ref 135–145)
Total Bilirubin: 0.5 mg/dL (ref 0.2–1.2)
Total Protein: 6.8 g/dL (ref 6.0–8.3)

## 2021-06-25 LAB — PSA: PSA: 1.82 ng/mL (ref 0.10–4.00)

## 2021-06-25 LAB — LDL CHOLESTEROL, DIRECT: Direct LDL: 68 mg/dL

## 2021-06-25 MED ORDER — ROSUVASTATIN CALCIUM 20 MG PO TABS: 20.0000 mg | ORAL_TABLET | Freq: Every day | ORAL | 1 refills | Status: DC

## 2021-06-25 MED ORDER — AMLODIPINE BESY-BENAZEPRIL HCL 5-20 MG PO CAPS: 1.0000 | ORAL_CAPSULE | Freq: Every day | ORAL | 3 refills | Status: DC

## 2021-06-25 NOTE — Patient Instructions (Addendum)
No change in meds today. I will let you know if concerns on labs.  ?If groin pain, I would recommend seeing the surgeon. See precautions about hernias below.  ?I will look for note from urology.  ?Side pain could be related to back pain.  See information below on treatment of back pain at home.  If that is not helping in the next few weeks I would consider x-ray of back and physical therapy.  Keep me posted.  Let me know if there are questions in the meantime.  If any worsening symptoms please return for recheck, otherwise follow-up in 6 months for physical. ? ?Chronic Back Pain ?When back pain lasts longer than 3 months, it is called chronic back pain. The cause of your back pain may not be known. Some common causes include: ?Wear and tear (degenerative disease) of the bones, ligaments, or disks in your back. ?Inflammation and stiffness in your back (arthritis). ?People who have chronic back pain often go through certain periods in which the pain is more intense (flare-ups). Many people can learn to manage the pain with home care. ?Follow these instructions at home: ?Pay attention to any changes in your symptoms. Take these actions to help with your pain: ?Managing pain and stiffness ? ?  ? ?If directed, apply ice to the painful area. Your health care provider may recommend applying ice during the first 24-48 hours after a flare-up begins. To do this: ?Put ice in a plastic bag. ?Place a towel between your skin and the bag. ?Leave the ice on for 20 minutes, 2-3 times per day. ?If directed, apply heat to the affected area as often as told by your health care provider. Use the heat source that your health care provider recommends, such as a moist heat pack or a heating pad. ?Place a towel between your skin and the heat source. ?Leave the heat on for 20-30 minutes. ?Remove the heat if your skin turns bright red. This is especially important if you are unable to feel pain, heat, or cold. You may have a greater risk of  getting burned. ?Try soaking in a warm tub. ?Activity ? ?Avoid bending and other activities that make the problem worse. ?Maintain a proper position when standing or sitting: ?When standing, keep your upper back and neck straight, with your shoulders pulled back. Avoid slouching. ?When sitting, keep your back straight and relax your shoulders. Do not round your shoulders or pull them backward. ?Do not sit or stand in one place for long periods of time. ?Take brief periods of rest throughout the day. This will reduce your pain. Resting in a lying or standing position is usually better than sitting to rest. ?When you are resting for longer periods, mix in some mild activity or stretching between periods of rest. This will help to prevent stiffness and pain. ?Get regular exercise. Ask your health care provider what activities are safe for you. ?Do not lift anything that is heavier than 10 lb (4.5 kg), or the limit that you are told, until your health care provider says that it is safe. Always use proper lifting technique, which includes: ?Bending your knees. ?Keeping the load close to your body. ?Avoiding twisting. ?Sleep on a firm mattress in a comfortable position. Try lying on your side with your knees slightly bent. If you lie on your back, put a pillow under your knees. ?Medicines ?Treatment may include medicines for pain and inflammation taken by mouth or applied to the skin, prescription pain  medicine, or muscle relaxants. Take over-the-counter and prescription medicines only as told by your health care provider. ?Ask your health care provider if the medicine prescribed to you: ?Requires you to avoid driving or using machinery. ?Can cause constipation. You may need to take these actions to prevent or treat constipation: ?Drink enough fluid to keep your urine pale yellow. ?Take over-the-counter or prescription medicines. ?Eat foods that are high in fiber, such as beans, whole grains, and fresh fruits and  vegetables. ?Limit foods that are high in fat and processed sugars, such as fried or sweet foods. ?General instructions ?Do not use any products that contain nicotine or tobacco, such as cigarettes, e-cigarettes, and chewing tobacco. If you need help quitting, ask your health care provider. ?Keep all follow-up visits as told by your health care provider. This is important. ?Contact a health care provider if: ?You have pain that is not relieved with rest or medicine. ?Your pain gets worse, or you have new pain. ?You have a high fever. ?You have rapid weight loss. ?You have trouble doing your normal activities. ?Get help right away if: ?You have weakness or numbness in one or both of your legs or feet. ?You have trouble controlling your bladder or your bowels. ?You have severe back pain and have any of the following: ?Nausea or vomiting. ?Pain in your abdomen. ?Shortness of breath or you faint. ?Summary ?Chronic back pain is back pain that lasts longer than 3 months. ?When a flare-up begins, apply ice to the painful area for the first 24-48 hours. ?Apply a moist heat pad or use a heating pad on the painful area as directed by your health care provider. ?When you are resting for longer periods, mix in some mild activity or stretching between periods of rest. This will help to prevent stiffness and pain. ?This information is not intended to replace advice given to you by your health care provider. Make sure you discuss any questions you have with your health care provider. ?Document Revised: 03/08/2019 Document Reviewed: 03/08/2019 ?Elsevier Patient Education ? [place]. ? ?

## 2021-06-25 NOTE — Progress Notes (Signed)
? ?Subjective:  ?Patient ID: Vincent Case, male    DOB: [birth year]  Age: 56 y.o. MRN: [id number] ? ?CC:  ?Chief Complaint  ?Patient presents with  ? Hyperlipidemia  ?  Due for recheck   ? Hypertension  ?  Pt here for recheck, denies physical sxs. No concerns   ? ? ?HPI ?Resean Case presents for  ? ?Hypertension: ?No health changes since last visit.  ?Amlodipine benazepril 5/20 mg daily. ?Home readings: occasional. Stable.  ?No new side effects, no ankle swelling.  ?BP Readings from Last 3 Encounters:  ?06/25/21 112/78  ?03/27/21 124/84  ?02/26/21 128/72  ? ?Lab Results  ?Component Value Date  ? CREATININE 1.15 10/30/2020  ? ?Hyperlipidemia: ?Crestor 20 mg daily.  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 Goal LDL under 70, option of adding ezetimibe.  ?No new myalgias/side effects. ?Exercise - walking daily 1-2 miles, average .  ?Lab Results  ?Component Value Date  ? CHOL 180 10/04/2020  ? HDL 60.30 10/04/2020  ? LDLCALC 90 10/04/2020  ? TRIG 147.0 10/04/2020  ? CHOLHDL 3 10/04/2020  ? ?Lab Results  ?Component Value Date  ? ALT 28 10/04/2020  ? AST 23 10/04/2020  ? ALKPHOS 56 10/04/2020  ? BILITOT 1.0 10/04/2020  ? ?HM: ?HIV/hep C screening discussed - had in past and negative. Results not available.  ? ?RLQ abd pain: ?See prior visit. Comes and goes, some pain in lower back. CT abd/pelvis in 03/2021. Small bilateral inguinal hernias, enlarged prostate. No acute or significant osseous findings.  ?Saw urology this am. Note pending. Kidney stone and cyst noted on CT - not concerning. Repeat eval of cyst in 6 months. Likely not cause of pain.  Possible muscular pain. No bloodwork.  ?No results found for: PSA1, PSA ?No change in pain - comes and goes, R side. Few weeks not noticed. No fever/n/v/bowel changes. No rash/swelling. No groin pain. Sore in am, better with stretching, worse with prolonged sitting.  ?No bowel or bladder incontinence, no saddle  anesthesia, no lower extremity weakness. No fever/wt loss/night sweats.  ? ? ?History ?Patient Active Problem List  ? Diagnosis Date Noted  ? Hypertension 12/08/2019  ? Hyperlipidemia 12/08/2019  ? Family history of early CAD 12/08/2019  ? ?Past Medical History:  ?Diagnosis Date  ? Hyperlipidemia   ? Hypertension   ? ?Past Surgical History:  ?Procedure Laterality Date  ? FRACTURE SURGERY  1984  ? ?No Known Allergies ?Prior to Admission medications   ?Medication Sig Start Date End Date Taking? Authorizing Provider  ?acyclovir (ZOVIRAX) 400 MG tablet Take 1 tablet (400 mg total) by mouth daily. 07/19/20  Yes Shade Flood, MD  ?amLODipine-benazepril (LOTREL) 5-20 MG capsule Take 1 capsule by mouth daily. 07/19/20  Yes Shade Flood, MD  ?Multiple Vitamins-Minerals (MENS MULTIVITAMIN PO) Take by mouth.   Yes [provider]  ?rosuvastatin (CRESTOR) 20 MG tablet Take 1 tablet (20 mg total) by mouth daily. 12/23/20  Yes Shade Flood, MD  ? ?Social History  ? ?Socioeconomic History  ? Marital status: Married  ?  Spouse name: Not on file  ? Number of children: Not on file  ? Years of education: Not on file  ? Highest education level: Not on file  ?Occupational History  ? Not on file  ?Tobacco Use  ? Smoking status: Never  ? Smokeless tobacco: Never  ?Vaping Use  ? Vaping Use: Never used  ?Substance and Sexual  Activity  ? Alcohol use: Yes  ? Drug use: Never  ? Sexual activity: Yes  ?  Birth control/protection: None  ?Other Topics Concern  ? Not on file  ?Social History Narrative  ? Not on file  ? ?Social Determinants of Health  ? ?Financial Resource Strain: Not on file  ?Food Insecurity: Not on file  ?Transportation Needs: Not on file  ?Physical Activity: Not on file  ?Stress: Not on file  ?Social Connections: Not on file  ?Intimate Partner Violence: Not on file  ? ? ?Review of Systems  ?Constitutional:  Negative for fatigue and unexpected weight change.  ?Eyes:  Negative for visual disturbance.   ?Respiratory:  Negative for cough, chest tightness and shortness of breath.   ?Cardiovascular:  Negative for chest pain, palpitations and leg swelling.  ?Gastrointestinal:  Negative for abdominal pain and blood in stool.  ?Neurological:  Negative for dizziness, light-headedness and headaches.  ? ? ?Objective:  ? ?Vitals:  ? 06/25/21 1137  ?BP: 112/78  ?Pulse: 83  ?Resp: 16  ?Temp: 98 ?F (36.7 ?C)  ?TempSrc: Temporal  ?SpO2: 97%  ?Weight: 195 lb (88.5 kg)  ?Height: 6' (1.829 m)  ? ? ? ?Physical Exam ?Vitals reviewed.  ?Constitutional:   ?   Appearance: He is well-developed.  ?HENT:  ?   Head: Normocephalic and atraumatic.  ?Neck:  ?   Vascular: No carotid bruit or JVD.  ?Cardiovascular:  ?   Rate and Rhythm: Normal rate and regular rhythm.  ?   Heart sounds: Normal heart sounds. No murmur heard. ?Pulmonary:  ?   Effort: Pulmonary effort is normal.  ?   Breath sounds: Normal breath sounds. No rales.  ?Abdominal:  ?   General: There is no distension.  ?   Palpations: There is no mass.  ?   Tenderness: There is no abdominal tenderness. There is no guarding or rebound.  ?   Hernia: No hernia is present.  ?Musculoskeletal:  ?   Right lower leg: No edema.  ?   Left lower leg: No edema.  ?   Comments: Lumbar spine, no midline bony tenderness, locates area of discomfort of the paraspinals towards the SI joint on the right.  Negative seated straight leg raise.  Abdomen nontender.  No CVA tenderness.  ?Skin: ?   General: Skin is warm and dry.  ?Neurological:  ?   Mental Status: He is alert and oriented to person, place, and time.  ?Psychiatric:     ?   Mood and Affect: Mood normal.  ? ? ?Assessment & Plan:  ?Donn PieriniMichael Case is a 56 y.o. male . ?Primary hypertension - Plan: amLODipine-benazepril (LOTREL) 5-20 MG capsule ? -Stable, continue same regimen ? ?Hyperlipidemia, unspecified hyperlipidemia type - Plan: Comprehensive metabolic panel, Lipid panel, rosuvastatin (CRESTOR) 20 MG tablet ? -Tolerating Crestor, continue same.   Check updated labs ? ?Bilateral inguinal hernia without obstruction or gangrene, recurrence not specified ? -Asymptomatic, option of minimal surgeon with hernia precautions. ? ?Right-sided low back pain without sciatica, unspecified chronicity ?RLQ abdominal pain ? -Overall reassuring CT scan.  MSK cause possible, possible radiating low back pain.  No red flags on exam or history.  Option of PT with imaging if not improving with home exercises.  Handout given.  RTC precautions given. ? ?Renal cyst, right ?Enlarged prostate - Plan: PSA ? -Recent urology visit by report, will watch for that note.  Check PSA. ? ?Meds ordered this encounter  ?Medications  ? rosuvastatin (CRESTOR) 20 MG  tablet  ?  Sig: Take 1 tablet (20 mg total) by mouth daily.  ?  Dispense:  90 tablet  ?  Refill:  1  ? amLODipine-benazepril (LOTREL) 5-20 MG capsule  ?  Sig: Take 1 capsule by mouth daily.  ?  Dispense:  90 capsule  ?  Refill:  3  ? ?Patient Instructions  ?No change in meds today. I will let you know if concerns on labs.  ?If groin pain, I would recommend seeing the surgeon. See precautions about hernias below.  ?I will look for note from urology.  ?Side pain could be related to back pain.  See information below on treatment of back pain at home.  If that is not helping in the next few weeks I would consider x-ray of back and physical therapy.  Keep me posted.  Let me know if there are questions in the meantime.  If any worsening symptoms please return for recheck, otherwise follow-up in 6 months for physical. ? ?Chronic Back Pain ?When back pain lasts longer than 3 months, it is called chronic back pain. The cause of your back pain may not be known. Some common causes include: ?Wear and tear (degenerative disease) of the bones, ligaments, or disks in your back. ?Inflammation and stiffness in your back (arthritis). ?People who have chronic back pain often go through certain periods in which the pain is more intense (flare-ups). Many  people can learn to manage the pain with home care. ?Follow these instructions at home: ?Pay attention to any changes in your symptoms. Take these actions to help with your pain: ?Managing pain and stiffness ? ?

## 2021-09-29 ENCOUNTER — Other Ambulatory Visit: Payer: Self-pay | Admitting: Family Medicine

## 2021-09-29 DIAGNOSIS — B009 Herpesviral infection, unspecified: Secondary | ICD-10-CM

## 2021-09-29 NOTE — Telephone Encounter (Signed)
Patient is requesting a refill of the following medications: Requested Prescriptions   Pending Prescriptions Disp Refills   acyclovir (ZOVIRAX) 400 MG tablet [Pharmacy Med Name: ACYCLOVIR TABS 400MG ] 90 tablet 3    Sig: TAKE 1 TABLET DAILY    Date of patient request: 09/29/2021 Last office visit: 06/25/2021 Date of last refill: 6/10/202 Last refill amount:  Follow up time period per chart: 12/25/2021

## 2021-12-25 ENCOUNTER — Ambulatory Visit (INDEPENDENT_AMBULATORY_CARE_PROVIDER_SITE_OTHER): Payer: 59 | Admitting: Family Medicine

## 2021-12-25 ENCOUNTER — Encounter: Payer: Self-pay | Admitting: Family Medicine

## 2021-12-25 VITALS — BP 124/72 | HR 76 | Temp 98.2°F | Ht 72.0 in | Wt 178.8 lb

## 2021-12-25 DIAGNOSIS — B009 Herpesviral infection, unspecified: Secondary | ICD-10-CM | POA: Diagnosis not present

## 2021-12-25 DIAGNOSIS — I10 Essential (primary) hypertension: Secondary | ICD-10-CM

## 2021-12-25 DIAGNOSIS — E785 Hyperlipidemia, unspecified: Secondary | ICD-10-CM | POA: Diagnosis not present

## 2021-12-25 DIAGNOSIS — Z0000 Encounter for general adult medical examination without abnormal findings: Secondary | ICD-10-CM

## 2021-12-25 MED ORDER — ACYCLOVIR 400 MG PO TABS: 400.0000 mg | ORAL_TABLET | Freq: Every day | ORAL | 3 refills | Status: DC

## 2021-12-25 MED ORDER — AMLODIPINE BESY-BENAZEPRIL HCL 5-20 MG PO CAPS: 1.0000 | ORAL_CAPSULE | Freq: Every day | ORAL | 3 refills | Status: DC

## 2021-12-25 MED ORDER — ROSUVASTATIN CALCIUM 20 MG PO TABS: 20.0000 mg | ORAL_TABLET | Freq: Every day | ORAL | 3 refills | Status: DC

## 2021-12-25 NOTE — Progress Notes (Signed)
Subjective:  Patient ID: Vincent Case, male    DOB: Oct 08, 1965  Age: 56 y.o. MRN: [id number]  CC:  Chief Complaint  Patient presents with   Annual Exam    Pt states all is well    HPI Vincent Case presents for Annual Exam  Doing well, no health changes.  Going camping in [place] soon.  Annual physical at work in October. Has labs form that visit: had EKG, on 11/27/2021, sinus bradycardia at 58, no acute findings.  Labs October 19, total cholesterol 153, triglycerides 62, LDL 64, HDL 76 chemistries normal, glucose 85 fasting.  Creatinine 1.08.  AST, ALT normal at 30, 31.  A1c 5.3, TSH 1.39.  CBC normal with hemoglobin 16.1, WBC 7.3, normal platelets at 165.  HCV antibody nonreactive. PSA has been normal 1.7 in November 2020, 2.1 in October 2022  Hypertension: Lotrel 5/20 mg daily, no new side effects.  Home readings: 120/70 range usually.  BP Readings from Last 3 Encounters:  12/25/21 124/72  06/25/21 112/78  03/27/21 124/84   Lab Results  Component Value Date   CREATININE 1.04 06/25/2021   Hyperlipidemia: Crestor 20 mg daily. No new myalgias or side effects Lab Results  Component Value Date   CHOL 151 06/25/2021   HDL 56.20 06/25/2021   LDLCALC 90 10/04/2020   LDLDIRECT 68.0 06/25/2021   TRIG 275.0 (H) 06/25/2021   CHOLHDL 3 06/25/2021   Lab Results  Component Value Date   ALT 24 06/25/2021   AST 17 06/25/2021   ALKPHOS 75 06/25/2021   BILITOT 0.5 06/25/2021       12/25/2021   10:47 AM 06/25/2021   11:39 AM 03/27/2021    9:10 AM 02/26/2021    1:05 PM 12/23/2020    2:04 PM  Depression screen PHQ 2/9  Decreased Interest 0 0 0 0 0  Down, Depressed, Hopeless 0 0 0 0 0  PHQ - 2 Score 0 0 0 0 0  Altered sleeping 0      Tired, decreased energy 0      Change in appetite 0      Feeling bad or failure about yourself  0      Trouble concentrating 0      Moving slowly or fidgety/restless 0      Suicidal thoughts 0      PHQ-9 Score 0        Health  Maintenance  Topic Date Due   Hepatitis C Screening  12/26/2022 (Originally 07/06/1983)   HIV Screening  12/26/2022 (Originally 07/05/1980)   TETANUS/TDAP  02/10/2027   COLONOSCOPY (Pts 45-457yrs Insurance coverage will need to be confirmed)  02/10/2028   INFLUENZA VACCINE  Completed   COVID-19 Vaccine  Completed   Zoster Vaccines- Shingrix  Completed   HPV VACCINES  Aged Out  [place] in 2020 in [place], repeat 5 yrs planned - polyp, records requested - he will check into name to request.  Prostate: does NOT have family history of prostate cancer The natural history of prostate cancer and ongoing controversy regarding screening and potential treatment outcomes of prostate cancer has been discussed with the patient. The meaning of a false positive PSA and a false negative PSA has been discussed. He indicates understanding of the limitations of this screening test and wishes to defer to next year - testing last year as above.   Lab Results  Component Value Date   PSA 1.82 06/25/2021   Acyclovir daily for HSV prevention.   Immunization History  Administered  Date(s) Administered   Influenza Inj Mdck Quad Pf 11/17/2021   Influenza,inj,Quad PF,6+ Mos 12/08/2019   Influenza-Unspecified 10/19/2020   PFIZER(Purple Top)SARS-COV-2 Vaccination 03/18/2019, 04/08/2019, 01/08/2020, 10/19/2020   Pfizer Covid-19 Vaccine Bivalent Booster 56yrs & up 11/17/2021   Tdap 02/09/2017   Zoster Recombinat (Shingrix) 07/19/2020, 12/23/2020  Utd as above   No results found. Optho yearly. Plans appt for glasses.   Dental: every 3 months  Alcohol: 10 per week, rare more than 2 per day.    Tobacco: none  Exercise: walking 1.5-40mi per day with dogs, daily.  FITT principle discussed.    History Patient Active Problem List   Diagnosis Date Noted   Hypertension 12/08/2019   Hyperlipidemia 12/08/2019   Family history of early CAD 12/08/2019   Past Medical History:  Diagnosis Date    Hyperlipidemia    Hypertension    Past Surgical History:  Procedure Laterality Date   FRACTURE SURGERY  1984   No Known Allergies Prior to Admission medications   Medication Sig Start Date End Date Taking? Authorizing Provider  acyclovir (ZOVIRAX) 400 MG tablet TAKE 1 TABLET DAILY 09/29/21  Yes Shade Flood, MD  amLODipine-benazepril (LOTREL) 5-20 MG capsule Take 1 capsule by mouth daily. 06/25/21  Yes Shade Flood, MD  Multiple Vitamins-Minerals (MENS MULTIVITAMIN PO) Take by mouth.   Yes [provider]  rosuvastatin (CRESTOR) 20 MG tablet Take 1 tablet (20 mg total) by mouth daily. 06/25/21  Yes Shade Flood, MD   Social History   Socioeconomic History   Marital status: Married    Spouse name: Not on file   Number of children: Not on file   Years of education: Not on file   Highest education level: Not on file  Occupational History   Not on file  Tobacco Use   Smoking status: Never   Smokeless tobacco: Never  Vaping Use   Vaping Use: Never used  Substance and Sexual Activity   Alcohol use: Yes   Drug use: Never   Sexual activity: Yes    Birth control/protection: None  Other Topics Concern   Not on file  Social History Narrative   Not on file   Social Determinants of Health   Financial Resource Strain: Not on file  Food Insecurity: Not on file  Transportation Needs: Not on file  Physical Activity: Not on file  Stress: Not on file  Social Connections: Not on file  Intimate Partner Violence: Not on file    Review of Systems 13 point review of systems per patient health survey noted.  Negative other than as indicated above or in HPI.    Objective:   Vitals:   12/25/21 1050  BP: 124/72  Pulse: 76  Temp: 98.2 F (36.8 C)  SpO2: 98%  Weight: 178 lb 12.8 oz (81.1 kg)  Height: 6' (1.829 m)     Physical Exam Vitals reviewed.  Constitutional:      Appearance: He is well-developed.  HENT:     Head: Normocephalic and atraumatic.      Right Ear: External ear normal.     Left Ear: External ear normal.  Eyes:     Conjunctiva/sclera: Conjunctivae normal.     Pupils: Pupils are equal, round, and reactive to light.  Neck:     Thyroid: No thyromegaly.  Cardiovascular:     Rate and Rhythm: Normal rate and regular rhythm.     Heart sounds: Normal heart sounds.  Pulmonary:     Effort: Pulmonary  effort is normal. No respiratory distress.     Breath sounds: Normal breath sounds. No wheezing.  Abdominal:     General: There is no distension.     Palpations: Abdomen is soft.     Tenderness: There is no abdominal tenderness.  Musculoskeletal:        General: No tenderness. Normal range of motion.     Cervical back: Normal range of motion and neck supple.  Lymphadenopathy:     Cervical: No cervical adenopathy.  Skin:    General: Skin is warm and dry.  Neurological:     Mental Status: He is alert and oriented to person, place, and time.     Deep Tendon Reflexes: Reflexes are normal and symmetric.  Psychiatric:        Behavior: Behavior normal.        Assessment & Plan:  Vincent Case is a 56 y.o. male . Annual physical exam  - -anticipatory guidance as below in AVS, screening labs above noted -no concerns.  Health maintenance items as above in HPI discussed/recommended as applicable.   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  Refilled  Primary hypertension - Plan: amLODipine-benazepril (LOTREL) 5-20 MG capsule  -  Stable, tolerating current regimen. Medications refilled. Labs noted as above  Hyperlipidemia, unspecified hyperlipidemia type - Plan: rosuvastatin (CRESTOR) 20 MG tablet  -  Stable, tolerating current regimen. Medications refilled. Labs from work noted as above.  57-month follow-up.  Meds ordered this encounter  Medications   acyclovir (ZOVIRAX) 400 MG tablet    Sig: Take 1 tablet (400 mg total) by mouth daily.    Dispense:   90 tablet    Refill:  3   amLODipine-benazepril (LOTREL) 5-20 MG capsule    Sig: Take 1 capsule by mouth daily.    Dispense:  90 capsule    Refill:  3   rosuvastatin (CRESTOR) 20 MG tablet    Sig: Take 1 tablet (20 mg total) by mouth daily.    Dispense:  90 tablet    Refill:  3   Patient Instructions  Recent labs look good.  No med changes for now.  Follow-up in 6 months.  Let me know if there are any questions.  If you are able to locate the office that performed your colonoscopy, please let us know so we can request that record.  Take care!   Preventive Care 75-24 Years Old, Male Preventive care refers to lifestyle choices and visits with your health care provider that can promote health and wellness. Preventive care visits are also called wellness exams. What can I expect for my preventive care visit? Counseling During your preventive care visit, your health care provider may ask about your: Medical history, including: Past medical problems. Family medical history. Current health, including: Emotional well-being. Home life and relationship well-being. Sexual activity. Lifestyle, including: Alcohol, nicotine or tobacco, and drug use. Access to firearms. Diet, exercise, and sleep habits. Safety issues such as seatbelt and bike helmet use. Sunscreen use. Work and work Astronomer. Physical exam Your health care provider will check your: Height and weight. These may be used to calculate your BMI (body mass index). BMI is a measurement that tells if you are at a healthy weight. Waist circumference. This measures the distance around your waistline. This measurement also tells if you are at a healthy weight and may help predict your risk of certain diseases, such as type 2 diabetes and  high blood pressure. Heart rate and blood pressure. Body temperature. Skin for abnormal spots. What immunizations do I need?  Vaccines are usually given at various ages, according to a schedule.  Your health care provider will recommend vaccines for you based on your age, medical history, and lifestyle or other factors, such as travel or where you work. What tests do I need? Screening Your health care provider may recommend screening tests for certain conditions. This may include: Lipid and cholesterol levels. Diabetes screening. This is done by checking your blood sugar (glucose) after you have not eaten for a while (fasting). Hepatitis B test. Hepatitis C test. HIV (human immunodeficiency virus) test. STI (sexually transmitted infection) testing, if you are at risk. Lung cancer screening. Prostate cancer screening. Colorectal cancer screening. Talk with your health care provider about your test results, treatment options, and if necessary, the need for more tests. Follow these instructions at home: Eating and drinking  Eat a diet that includes fresh fruits and vegetables, whole grains, lean protein, and low-fat dairy products. Take vitamin and mineral supplements as recommended by your health care provider. Do not drink alcohol if your health care provider tells you not to drink. If you drink alcohol: Limit how much you have to 0-2 drinks a day. Know how much alcohol is in your drink. In the [place] one drink equals one 12 oz bottle of beer (355 mL), one 5 oz glass of wine (148 mL), or one 1 oz glass of hard liquor (44 mL). Lifestyle Brush your teeth every morning and night with fluoride toothpaste. Floss one time each day. Exercise for at least 30 minutes 5 or more days each week. Do not use any products that contain nicotine or tobacco. These products include cigarettes, chewing tobacco, and vaping devices, such as e-cigarettes. If you need help quitting, ask your health care provider. Do not use drugs. If you are sexually active, practice safe sex. Use a condom or other form of protection to prevent STIs. Take aspirin only as told by your health care provider. Make sure that  you understand how much to take and what form to take. Work with your health care provider to find out whether it is safe and beneficial for you to take aspirin daily. Find healthy ways to manage stress, such as: Meditation, yoga, or listening to music. Journaling. Talking to a trusted person. Spending time with friends and family. Minimize exposure to UV radiation to reduce your risk of skin cancer. Safety Always wear your seat belt while driving or riding in a vehicle. Do not drive: If you have been drinking alcohol. Do not ride with someone who has been drinking. When you are tired or distracted. While texting. If you have been using any mind-altering substances or drugs. Wear a helmet and other protective equipment during sports activities. If you have firearms in your house, make sure you follow all gun safety procedures. What's next? Go to your health care provider once a year for an annual wellness visit. Ask your health care provider how often you should have your eyes and teeth checked. Stay up to date on all vaccines. This information is not intended to replace advice given to you by your health care provider. Make sure you discuss any questions you have with your health care provider. Document Revised: 07/24/2020 Document Reviewed: 07/24/2020 Elsevier Patient Education  2023 [employer]     Signed,   Meredith Staggers, MD [facility], [facility] 12/25/21  11:36 AM

## 2021-12-25 NOTE — Patient Instructions (Addendum)
Recent labs look good.  No med changes for now.  Follow-up in 6 months.  Let me know if there are any questions.  If you are able to locate the office that performed your colonoscopy, please let us know so we can request that record.  Take care!   Preventive Care 39-56 Years Old, Male Preventive care refers to lifestyle choices and visits with your health care provider that can promote health and wellness. Preventive care visits are also called wellness exams. What can I expect for my preventive care visit? Counseling During your preventive care visit, your health care provider may ask about your: Medical history, including: Past medical problems. Family medical history. Current health, including: Emotional well-being. Home life and relationship well-being. Sexual activity. Lifestyle, including: Alcohol, nicotine or tobacco, and drug use. Access to firearms. Diet, exercise, and sleep habits. Safety issues such as seatbelt and bike helmet use. Sunscreen use. Work and work Statistician. Physical exam Your health care provider will check your: Height and weight. These may be used to calculate your BMI (body mass index). BMI is a measurement that tells if you are at a healthy weight. Waist circumference. This measures the distance around your waistline. This measurement also tells if you are at a healthy weight and may help predict your risk of certain diseases, such as type 2 diabetes and high blood pressure. Heart rate and blood pressure. Body temperature. Skin for abnormal spots. What immunizations do I need?  Vaccines are usually given at various ages, according to a schedule. Your health care provider will recommend vaccines for you based on your age, medical history, and lifestyle or other factors, such as travel or where you work. What tests do I need? Screening Your health care provider may recommend screening tests for certain conditions. This may include: Lipid and cholesterol  levels. Diabetes screening. This is done by checking your blood sugar (glucose) after you have not eaten for a while (fasting). Hepatitis B test. Hepatitis C test. HIV (human immunodeficiency virus) test. STI (sexually transmitted infection) testing, if you are at risk. Lung cancer screening. Prostate cancer screening. Colorectal cancer screening. Talk with your health care provider about your test results, treatment options, and if necessary, the need for more tests. Follow these instructions at home: Eating and drinking  Eat a diet that includes fresh fruits and vegetables, whole grains, lean protein, and low-fat dairy products. Take vitamin and mineral supplements as recommended by your health care provider. Do not drink alcohol if your health care provider tells you not to drink. If you drink alcohol: Limit how much you have to 0-2 drinks a day. Know how much alcohol is in your drink. In the [place] one drink equals one 12 oz bottle of beer (355 mL), one 5 oz glass of wine (148 mL), or one 1 oz glass of hard liquor (44 mL). Lifestyle Brush your teeth every morning and night with fluoride toothpaste. Floss one time each day. Exercise for at least 30 minutes 5 or more days each week. Do not use any products that contain nicotine or tobacco. These products include cigarettes, chewing tobacco, and vaping devices, such as e-cigarettes. If you need help quitting, ask your health care provider. Do not use drugs. If you are sexually active, practice safe sex. Use a condom or other form of protection to prevent STIs. Take aspirin only as told by your health care provider. Make sure that you understand how much to take and what form to take. Work with  your health care provider to find out whether it is safe and beneficial for you to take aspirin daily. Find healthy ways to manage stress, such as: Meditation, yoga, or listening to music. Journaling. Talking to a trusted person. Spending time  with friends and family. Minimize exposure to UV radiation to reduce your risk of skin cancer. Safety Always wear your seat belt while driving or riding in a vehicle. Do not drive: If you have been drinking alcohol. Do not ride with someone who has been drinking. When you are tired or distracted. While texting. If you have been using any mind-altering substances or drugs. Wear a helmet and other protective equipment during sports activities. If you have firearms in your house, make sure you follow all gun safety procedures. What's next? Go to your health care provider once a year for an annual wellness visit. Ask your health care provider how often you should have your eyes and teeth checked. Stay up to date on all vaccines. This information is not intended to replace advice given to you by your health care provider. Make sure you discuss any questions you have with your health care provider. Document Revised: 07/24/2020 Document Reviewed: 07/24/2020 Elsevier Patient Education  2023 [employer].

## 2022-06-25 ENCOUNTER — Ambulatory Visit: Payer: 59 | Admitting: Family Medicine

## 2022-07-27 ENCOUNTER — Ambulatory Visit (INDEPENDENT_AMBULATORY_CARE_PROVIDER_SITE_OTHER): Payer: 59 | Admitting: Family Medicine

## 2022-07-27 VITALS — BP 128/74 | HR 74 | Temp 98.6°F | Ht 72.0 in | Wt 165.4 lb

## 2022-07-27 DIAGNOSIS — M25511 Pain in right shoulder: Secondary | ICD-10-CM | POA: Diagnosis not present

## 2022-07-27 DIAGNOSIS — I10 Essential (primary) hypertension: Secondary | ICD-10-CM | POA: Diagnosis not present

## 2022-07-27 DIAGNOSIS — G8929 Other chronic pain: Secondary | ICD-10-CM | POA: Diagnosis not present

## 2022-07-27 DIAGNOSIS — E785 Hyperlipidemia, unspecified: Secondary | ICD-10-CM

## 2022-07-27 MED ORDER — ROSUVASTATIN CALCIUM 20 MG PO TABS: 20.0000 mg | ORAL_TABLET | Freq: Every day | ORAL | 3 refills | Status: DC

## 2022-07-27 MED ORDER — AMLODIPINE BESY-BENAZEPRIL HCL 5-20 MG PO CAPS
1.0000 | ORAL_CAPSULE | Freq: Every day | ORAL | 3 refills | Status: DC
Start: 2022-07-27 — End: 2023-08-05

## 2022-07-27 NOTE — Progress Notes (Signed)
Subjective:  Patient ID: Vincent Case, male    DOB: 30-Dec-1965  Age: 57 y.o. MRN: [id number]  CC:  Chief Complaint  Patient presents with   Medical Management of Chronic Issues   Shoulder Pain    Rt shoulder pain, sore, getting worse over last 6 months, notes he has been working out more     HPI Vincent Case presents for   Hypertension: Amlodipine benazepril 5/20 mg daily. No new side effects.  Home readings: 120/70 range.  BP Readings from Last 3 Encounters:  07/27/22 128/74  12/25/21 124/72  06/25/21 112/78   Lab Results  Component Value Date   CREATININE 1.04 06/25/2021    Hyperlipidemia: Crestor 20 mg daily. No new myalgia/side effects. [facility] labs in 11/2021.  Total cholesterol 153, HDL 76, LDL 64, triglycerides 62 in October 2023. Repeat labs scheduled in October. Will send me copy.  Lab Results  Component Value Date   CHOL 151 06/25/2021   HDL 56.20 06/25/2021   LDLCALC 90 10/04/2020   LDLDIRECT 68.0 06/25/2021   TRIG 275.0 (H) 06/25/2021   CHOLHDL 3 06/25/2021   Lab Results  Component Value Date   ALT 24 06/25/2021   AST 17 06/25/2021   ALKPHOS 75 06/25/2021   BILITOT 0.5 06/25/2021   Right shoulder pain Initially had injury throwing ball 5-7 years ago. Hurt, but dealt with it. No medical eval. No specific limitations  More exercise past 6 months. More sore. Feels better after leaving gym, sore later in day or next am. Variable degrees of soreness.  No limitations in ROM, strength or activity. Just sore. Avoiding certain exercises only.  R hand dominant.  Tx: none recently No prior treatment      History Patient Active Problem List   Diagnosis Date Noted   Hypertension 12/08/2019   Hyperlipidemia 12/08/2019   Family history of early CAD 12/08/2019   Past Medical History:  Diagnosis Date   Hyperlipidemia    Hypertension    Past Surgical History:  Procedure Laterality Date   FRACTURE SURGERY  1984   No Known  Allergies Prior to Admission medications   Medication Sig Start Date End Date Taking? Authorizing Provider  acyclovir (ZOVIRAX) 400 MG tablet Take 1 tablet (400 mg total) by mouth daily. 12/25/21  Yes Shade Flood, MD  amLODipine-benazepril (LOTREL) 5-20 MG capsule Take 1 capsule by mouth daily. 12/25/21  Yes Shade Flood, MD  Multiple Vitamins-Minerals (MENS MULTIVITAMIN PO) Take by mouth.   Yes [provider]  rosuvastatin (CRESTOR) 20 MG tablet Take 1 tablet (20 mg total) by mouth daily. 12/25/21  Yes Shade Flood, MD   Social History   Socioeconomic History   Marital status: Married    Spouse name: Not on file   Number of children: Not on file   Years of education: Not on file   Highest education level: Not on file  Occupational History   Not on file  Tobacco Use   Smoking status: Never   Smokeless tobacco: Never  Vaping Use   Vaping Use: Never used  Substance and Sexual Activity   Alcohol use: Yes   Drug use: Never   Sexual activity: Yes    Birth control/protection: None  Other Topics Concern   Not on file  Social History Narrative   Not on file   Social Determinants of Health   Financial Resource Strain: Not on file  Food Insecurity: Not on file  Transportation Needs: Not on file  Physical  Activity: Sufficiently Active (07/27/2022)   Exercise Vital Sign    Days of Exercise per Week: 4 days    Minutes of Exercise per Session: 40 min  Stress: No Stress Concern Present (07/27/2022)   [employer] of Occupational Health - Occupational Stress Questionnaire    Feeling of Stress : Not at all  Social Connections: Not on file  Intimate Partner Violence: Not on file    Review of Systems  Constitutional:  Negative for fatigue and unexpected weight change.  Eyes:  Negative for visual disturbance.  Respiratory:  Negative for cough, chest tightness and shortness of breath.   Cardiovascular:  Negative for chest pain, palpitations and leg  swelling.  Gastrointestinal:  Negative for abdominal pain and blood in stool.  Neurological:  Negative for dizziness, light-headedness and headaches.     Objective:   Vitals:   07/27/22 0909  BP: 128/74  Pulse: 74  Temp: 98.6 F (37 C)  TempSrc: Temporal  SpO2: 97%  Weight: 165 lb 6.4 oz (75 kg)  Height: 6' (1.829 m)     Physical Exam Vitals reviewed.  Constitutional:      Appearance: He is well-developed.  HENT:     Head: Normocephalic and atraumatic.  Neck:     Vascular: No carotid bruit or JVD.  Cardiovascular:     Rate and Rhythm: Normal rate and regular rhythm.     Heart sounds: Normal heart sounds. No murmur heard. Pulmonary:     Effort: Pulmonary effort is normal.     Breath sounds: Normal breath sounds. No rales.  Musculoskeletal:     Right lower leg: No edema.     Left lower leg: No edema.     Comments: C-spine, pain-free range of motion, intact range of motion. Right shoulder, clavicle nontender, minimal discomfort at [facility], reports this as area of discomfort with certain activities.  Negative piano key,  no soft tissue swelling. Intact range of motion of shoulder, full rotator cuff strength without discomfort on testing.  Negative drop arm, empty can, liftoff.  Negative Neer, Hawkins.  Unable to reproduce discomfort with crossover, but does report this is the usual exercise that causes discomfort.  Neurovascular intact distally.  Skin:    General: Skin is warm and dry.  Neurological:     Mental Status: He is alert and oriented to person, place, and time.  Psychiatric:        Mood and Affect: Mood normal.        Assessment & Plan:  Vincent Case is a 57 y.o. male . Primary hypertension  -Stable, tolerating current med regimen.  Labs planned in the next few months with work, stable previously.  Continue same regimen with 3-month follow-up for physical.  Commended on exercise.  Hyperlipidemia, unspecified hyperlipidemia type  -Improved triglycerides  on outside note tolerating current med regimen with plan lab work through his [facility] in the next few months.  Hold on labs at this time, he will send me a copy of that lab work.  Chronic right shoulder pain - Plan: Ambulatory referral to Orthopedic Surgery  -Initial injury 5 to 7 years ago, possible AC sprain at that time.  Minimal symptoms until increased exercise, certain activities including crossover cable exercises tend to cause discomfort.  Suspected AC arthritis versus recurrent discomfort with prior sprain.  Overall reassuring exam, no limitations in activity.  Initial trial of Voltaren gel topical, referred to Ortho to decide on imaging, possible injection.  Activity modification recommended in the interim  No orders of the defined types were placed in this encounter.  Patient Instructions  Based on location of shoulder pain, I suspect you could have had an acromioclavicular joint sprain or strain initially and could have some arthritis or irritation of that joint recently with certain activities.  Avoid specific exercises that cause more pain in that area for now, I will refer you to orthopedics, and we will hold off on imaging as that may be done at Ortho.  Try Voltaren gel over-the-counter few times per day over that sore area to see if that helps.  No med changes at this time.  Please send me a copy of the labs in October.  Take care.      Signed,   Meredith Staggers, MD [facility], [facility] 07/27/22 9:43 AM

## 2022-07-27 NOTE — Patient Instructions (Addendum)
Based on location of shoulder pain, I suspect you could have had an acromioclavicular joint sprain or strain initially and could have some arthritis or irritation of that joint recently with certain activities.  Avoid specific exercises that cause more pain in that area for now, I will refer you to orthopedics, and we will hold off on imaging as that may be done at Ortho.  Try Voltaren gel over-the-counter few times per day over that sore area to see if that helps.  No med changes at this time.  Please send me a copy of the labs in October.  Take care.

## 2022-08-21 ENCOUNTER — Other Ambulatory Visit: Payer: Self-pay

## 2022-08-21 ENCOUNTER — Other Ambulatory Visit (INDEPENDENT_AMBULATORY_CARE_PROVIDER_SITE_OTHER): Payer: 59

## 2022-08-21 ENCOUNTER — Ambulatory Visit (INDEPENDENT_AMBULATORY_CARE_PROVIDER_SITE_OTHER): Payer: 59 | Admitting: Orthopedic Surgery

## 2022-08-21 ENCOUNTER — Encounter: Payer: Self-pay | Admitting: Orthopedic Surgery

## 2022-08-21 DIAGNOSIS — M19011 Primary osteoarthritis, right shoulder: Secondary | ICD-10-CM | POA: Diagnosis not present

## 2022-08-21 DIAGNOSIS — M25511 Pain in right shoulder: Secondary | ICD-10-CM

## 2022-08-21 MED ORDER — LIDOCAINE HCL 1 % IJ SOLN
3.0000 mL | INTRAMUSCULAR | Status: AC | PRN
Start: 2022-08-21 — End: 2022-08-21
  Administered 2022-08-21: 3 mL

## 2022-08-21 MED ORDER — METHYLPREDNISOLONE ACETATE 40 MG/ML IJ SUSP
13.3300 mg | INTRAMUSCULAR | Status: AC | PRN
Start: 2022-08-21 — End: 2022-08-21
  Administered 2022-08-21: 13.33 mg via INTRA_ARTICULAR

## 2022-08-21 MED ORDER — BUPIVACAINE HCL 0.25 % IJ SOLN
0.6600 mL | INTRAMUSCULAR | Status: AC | PRN
Start: 2022-08-21 — End: 2022-08-21
  Administered 2022-08-21: .66 mL via INTRA_ARTICULAR

## 2022-08-21 NOTE — Progress Notes (Signed)
Office Visit Note   Patient: Vincent Case           Date of Birth: 21-Jun-1965           MRN: [id number] Visit Date: 08/21/2022 Requested by: Shade Flood, MD [address] [place],  [place] [address] PCP: Shade Flood, MD  Subjective: Chief Complaint  Patient presents with   Right Shoulder - Pain    HPI: Vincent Case is a 57 y.o. male who presents to the office reporting right shoulder pain of 5 to 6 years duration.  He thinks he may have injured it years ago playing baseball.  More recently the pain has increased that he had worked with the trainer over the past several months.  He is right-hand dominant.  Pain does not wake him from sleep at night.  Does not use medication for his symptoms.  Some days are okay some days are painful.  He is able to sleep on the right-hand side.  Reports just pain but no weakness.  Bench press and cable pulls aggravate him during his workouts.  Does not report much in the way of mechanical symptoms..                ROS: All systems reviewed are negative as they relate to the chief complaint within the history of present illness.  Patient denies fevers or chills.  Assessment & Plan: Visit Diagnoses:  1. Right shoulder pain, unspecified chronicity     Plan: Impression is right shoulder pain which localized to the [facility] joint.  Ultrasound-guided AC joint injection performed today.  Follow-up as needed.  I did caution him against doing the classic types of workout exercises such as bench press dips and dumbbell flies across midline which can aggravate the [facility] joint.  He will follow-up as needed.  Follow-Up Instructions: No follow-ups on file.   Orders:  Orders Placed This Encounter  Procedures   XR Shoulder Right   US Guided Needle Placement - No Linked Charges   No orders of the defined types were placed in this encounter.     Procedures: Medium Joint Inj: R acromioclavicular on 08/21/2022 12:50 PM Indications: diagnostic  evaluation and pain Details: 25 G 1.5 in needle, ultrasound-guided superior approach Medications: 3 mL lidocaine 1 %; 0.66 mL bupivacaine 0.25 %; 13.33 mg methylPREDNISolone acetate 40 MG/ML Outcome: tolerated well, no immediate complications Procedure, treatment alternatives, risks and benefits explained, specific risks discussed. Consent was given by the patient. Immediately prior to procedure a time out was called to verify the correct patient, procedure, equipment, support staff and site/side marked as required. Patient was prepped and draped in the usual sterile fashion.       Clinical Data: No additional findings.  Objective: Vital Signs: There were no vitals taken for this visit.  Physical Exam:  Constitutional: Patient appears well-developed HEENT:  Head: Normocephalic Eyes:EOM are normal Neck: Normal range of motion Cardiovascular: Normal rate Pulmonary/chest: Effort normal Neurologic: Patient is alert Skin: Skin is warm Psychiatric: Patient has normal mood and affect  Ortho Exam: Ortho exam demonstrates excellent range of motion of the right shoulder of 60/110/175.  Rotator cuff strength is intact infraspinatus supraspinatus and subscap muscle testing.  No masses lymphadenopathy or skin changes noted in the shoulder region.  Does have little popping in the [facility] joint.  Some pain with crossarm adduction.  Positive O'Brien's testing negative speeds testing.  Specialty Comments:  No specialty comments available.  Imaging: XR Shoulder  Right  Result Date: 08/21/2022 AP lateral axillary radiographs right shoulder reviewed.  AC joint arthritis is present.  No glenohumeral joint arthritis.  Shoulder is located.  No acute fracture.  Visualized lung fields clear.  Acromiohumeral distance maintained.  US Guided Needle Placement - No Linked Charges  Result Date: 08/21/2022 Ultrasound imaging demonstrates needle placement into the right [facility] joint with extravasation of fluid and no  complicating features    PMFS History: Patient Active Problem List   Diagnosis Date Noted   Hypertension 12/08/2019   Hyperlipidemia 12/08/2019   Family history of early CAD 12/08/2019   Past Medical History:  Diagnosis Date   Hyperlipidemia    Hypertension     Family History  Problem Relation Age of Onset   Early death Father        Heart Attachl   Cancer Maternal Grandmother     Past Surgical History:  Procedure Laterality Date   FRACTURE SURGERY  1984   Social History   Occupational History   Not on file  Tobacco Use   Smoking status: Never   Smokeless tobacco: Never  Vaping Use   Vaping status: Never Used  Substance and Sexual Activity   Alcohol use: Yes   Drug use: Never   Sexual activity: Yes    Birth control/protection: None

## 2022-10-01 ENCOUNTER — Encounter: Payer: Self-pay | Admitting: Family Medicine

## 2022-10-01 ENCOUNTER — Other Ambulatory Visit (HOSPITAL_COMMUNITY)
Admission: RE | Admit: 2022-10-01 | Discharge: 2022-10-01 | Disposition: A | Discharge status: Home / Self Care | Payer: 59 | Source: Ambulatory Visit | Attending: Family Medicine | Admitting: Family Medicine

## 2022-10-01 ENCOUNTER — Ambulatory Visit (INDEPENDENT_AMBULATORY_CARE_PROVIDER_SITE_OTHER): Payer: 59 | Admitting: Family Medicine

## 2022-10-01 VITALS — BP 138/80 | HR 79 | Temp 98.9°F | Ht 72.0 in | Wt 161.0 lb

## 2022-10-01 DIAGNOSIS — N342 Other urethritis: Secondary | ICD-10-CM | POA: Insufficient documentation

## 2022-10-01 DIAGNOSIS — Z113 Encounter for screening for infections with a predominantly sexual mode of transmission: Secondary | ICD-10-CM

## 2022-10-01 DIAGNOSIS — R300 Dysuria: Secondary | ICD-10-CM

## 2022-10-01 DIAGNOSIS — R350 Frequency of micturition: Secondary | ICD-10-CM

## 2022-10-01 LAB — POC URINALSYSI DIPSTICK (AUTOMATED)
Bilirubin, UA: NEGATIVE
Blood, UA: NEGATIVE
Glucose, UA: NEGATIVE
Ketones, UA: POSITIVE
Leukocytes, UA: NEGATIVE
Nitrite, UA: NEGATIVE
Protein, UA: NEGATIVE
Spec Grav, UA: 1.015 (ref 1.010–1.025)
Urobilinogen, UA: 0.2 E.U./dL
pH, UA: 6.5 (ref 5.0–8.0)

## 2022-10-01 MED ORDER — DOXYCYCLINE HYCLATE 100 MG PO TABS: 100.0000 mg | ORAL_TABLET | Freq: Two times a day (BID) | ORAL | 0 refills | Status: DC

## 2022-10-01 NOTE — Patient Instructions (Signed)
I am suspicious for an infection in the urethra but could be infection anywhere in the urinary tract.  I will check some other labs today including a prostate test and sexually-transmitted infection testing.  Start doxycycline 1 pill twice per day, see information below on urethritis.  Depending on labs we can decide if other treatment is needed but please follow-up if symptoms do not improve.  Condoms, safer sex practices recommended at this time.  Thank you for coming in today and take care.  Let me know if there are questions.  Urethritis, Adult  Urethritis is a swelling (inflammation) of the urethra. The urethra is the tube that drains urine from the bladder. It is important to get treatment for this condition early. Delayed treatment may lead to complications, such as an infection in the urinary tract (ureters, kidneys, and bladder). What are the causes? This condition may be caused by: Germs that are spread through sexual contact. This is the leading cause of urethritis. This may include bacterial or viral infections. Injury to the urethra. This can happen after a thin, flexible tube (catheter) is inserted into the urethra to drain urine, or after medical instruments or foreign bodies are inserted into the area. Chemical irritation. This may include contact with spermicide or prolonged contact with chemicals in bubble bath, shampoo, or perfumed soaps. A disease that causes inflammation. This is rare. What increases the risk? The following factors may make you more likely to develop this condition: Having sex without using a condom. Having multiple sexual partners. Having poor hygiene. What are the signs or symptoms? Symptoms of this condition include: Pain with urination. Frequent urination. An urgent need to urinate. Itching and pain in the vagina or penis. Discharge or bleeding coming from the penis. Most women have no symptoms. How is this diagnosed? This condition may be diagnosed  based on: Your symptoms. Your medical history. A physical exam. Tests may also be done. These may include: Urine tests. Swabs from the urethra. How is this treated? Treatment for this condition depends on the cause. Urethritis caused by a bacterial infection is treated with antibiotic medicine. Sexual partners must also be treated. Follow these instructions at home: Medicines Take over-the-counter and prescription medicines only as told by your health care provider. If you were prescribed an antibiotic, take it as told by your health care provider. Do not stop taking the antibiotic even if you start to feel better. Lifestyle Avoid using perfumed soaps, bubble bath, and shampoo when you bathe or shower. Rinse the vaginal area after bathing. Wear cotton underwear. Not wearing underwear when going to sleep can help. Make sure to wipe from front to back after using the toilet if you are male. Do not have sex until your health care provider approves. When you do have sex, be sure to practice safe sex. Any sexual partners you have had in the past 60 days should be treated. General instructions Drink enough fluid to keep your urine pale yellow. It is up to you to get your test results. Ask your health care provider, or the department that is doing the test, when your results will be ready. Keep all follow-up visits. This is important. Get tested again 3 months after treatment to make sure the infection is gone. It is important that your sexual partner also gets tested again. Contact a health care provider if: Your symptoms have not improved after 3 days. Your symptoms get worse. You have eye redness or pain. You develop abdominal pain  or pelvic pain (in females). You develop joint pain or a rash. You have a fever or chills. Get help right away if: You have severe pain in the belly, back, or side. You vomit repeatedly. Summary Urethritis is a swelling (inflammation) of the  urethra. Germs that are spread through sexual contact are the most common cause of this condition. It is important to get treatment for this condition early. Delayed treatment may lead to complications. Treatment for this condition depends on the cause. Any sexual partners must also be treated. This information is not intended to replace advice given to you by your health care provider. Make sure you discuss any questions you have with your health care provider. Document Revised: 09/03/2019 Document Reviewed: 09/03/2019 Elsevier Patient Education  2024 [employer].

## 2022-10-01 NOTE — Progress Notes (Signed)
Subjective:  Patient ID: Vincent Case, male    DOB: Jun 22, 1965  Age: 57 y.o. MRN: [id number]  CC:  Chief Complaint  Patient presents with   Urinary Concern    Pt c/o burning, urgency and frequency starting 5-6 weeks ago, denies taking AZO or any other OTC meds    HPI Vincent Case presents for   Dysuria, frequency: Initial burning with urination, minor 5 weeks ago. Frequent urination, urgency. No fever, abd or back pain.  No hematuria.  No nocturia.  No penile d/c or testicular pain. Minor penile discomfort during the day. No penile rash. No flu like symptoms, no unintended weight loss/night sweats.  New sexual contact with unprotected intercourse at that time.  No hx of STI.   History Patient Active Problem List   Diagnosis Date Noted   Hypertension 12/08/2019   Hyperlipidemia 12/08/2019   Family history of early CAD 12/08/2019   Past Medical History:  Diagnosis Date   Hyperlipidemia    Hypertension    Past Surgical History:  Procedure Laterality Date   FRACTURE SURGERY  1984   No Known Allergies Prior to Admission medications   Medication Sig Start Date End Date Taking? Authorizing Provider  acyclovir (ZOVIRAX) 400 MG tablet Take 1 tablet (400 mg total) by mouth daily. 12/25/21  Yes Shade Flood, MD  amLODipine-benazepril (LOTREL) 5-20 MG capsule Take 1 capsule by mouth daily. 07/27/22  Yes Shade Flood, MD  Multiple Vitamins-Minerals (MENS MULTIVITAMIN PO) Take by mouth.   Yes [provider]  rosuvastatin (CRESTOR) 20 MG tablet Take 1 tablet (20 mg total) by mouth daily. 07/27/22  Yes Shade Flood, MD   Social History   Socioeconomic History   Marital status: Married    Spouse name: Not on file   Number of children: Not on file   Years of education: Not on file   Highest education level: Not on file  Occupational History   Not on file  Tobacco Use   Smoking status: Never   Smokeless tobacco: Never  Vaping Use   Vaping status:  Never Used  Substance and Sexual Activity   Alcohol use: Yes   Drug use: Never   Sexual activity: Yes    Birth control/protection: None  Other Topics Concern   Not on file  Social History Narrative   Not on file   Social Determinants of Health   Financial Resource Strain: Not on file  Food Insecurity: Not on file  Transportation Needs: Not on file  Physical Activity: Sufficiently Active (07/27/2022)   Exercise Vital Sign    Days of Exercise per Week: 4 days    Minutes of Exercise per Session: 40 min  Stress: No Stress Concern Present (07/27/2022)   [employer] of Occupational Health - Occupational Stress Questionnaire    Feeling of Stress : Not at all  Social Connections: Not on file  Intimate Partner Violence: Not on file    Review of Systems   Objective:   Vitals:   10/01/22 0859  BP: 138/80  Pulse: 79  Temp: 98.9 F (37.2 C)  SpO2: 98%  Weight: 161 lb (73 kg)  Height: 6' (1.829 m)     Physical Exam Constitutional:      General: He is not in acute distress.    Appearance: Normal appearance. He is well-developed.  HENT:     Head: Normocephalic and atraumatic.  Cardiovascular:     Rate and Rhythm: Normal rate.  Pulmonary:  Effort: Pulmonary effort is normal.  Abdominal:     General: Abdomen is flat. Bowel sounds are normal. There is no distension.     Tenderness: There is no abdominal tenderness. There is no right CVA tenderness or left CVA tenderness.  Genitourinary:    Penis: Normal.      Comments: No penile rash or discharge, no epididymal tenderness or testicular tenderness.  No hernia appreciated. Skin:    Findings: No rash.  Neurological:     Mental Status: He is alert and oriented to person, place, and time.  Psychiatric:        Mood and Affect: Mood normal.      Results for orders placed or performed in visit on 10/01/22  POCT Urinalysis Dipstick (Automated)  Result Value Ref Range   Color, UA dark yellow    Clarity, UA clear     Glucose, UA Negative Negative   Bilirubin, UA Negative    Ketones, UA Positive    Spec Grav, UA 1.015 1.010 - 1.025   Blood, UA Negative    pH, UA 6.5 5.0 - 8.0   Protein, UA Negative Negative   Urobilinogen, UA 0.2 0.2 or 1.0 E.U./dL   Nitrite, UA Negative    Leukocytes, UA Negative Negative     Assessment & Plan:  Vincent Case is a 57 y.o. male . Urinary frequency - Plan: POCT Urinalysis Dipstick (Automated), Urine Culture  Dysuria - Plan: doxycycline (VIBRA-TABS) 100 MG tablet  Infective urethritis - Plan: Urine Culture, HIV Antibody (routine testing w rflx), PSA, RPR, Urine cytology ancillary only, doxycycline (VIBRA-TABS) 100 MG tablet  Screening examination for STI - Plan: HIV Antibody (routine testing w rflx), PSA, RPR, Urine cytology ancillary only  Appears to be infectious urethritis.  Check STI screening as above, start doxycycline, additional meds depending on results.  Check PSA and urine culture.  Safe sex practices, RTC precautions given.  Meds ordered this encounter  Medications   doxycycline (VIBRA-TABS) 100 MG tablet    Sig: Take 1 tablet (100 mg total) by mouth 2 (two) times daily.    Dispense:  14 tablet    Refill:  0   Patient Instructions  I am suspicious for an infection in the urethra but could be infection anywhere in the urinary tract.  I will check some other labs today including a prostate test and sexually-transmitted infection testing.  Start doxycycline 1 pill twice per day, see information below on urethritis.  Depending on labs we can decide if other treatment is needed but please follow-up if symptoms do not improve.  Condoms, safer sex practices recommended at this time.  Thank you for coming in today and take care.  Let me know if there are questions.  Urethritis, Adult  Urethritis is a swelling (inflammation) of the urethra. The urethra is the tube that drains urine from the bladder. It is important to get treatment for this condition  early. Delayed treatment may lead to complications, such as an infection in the urinary tract (ureters, kidneys, and bladder). What are the causes? This condition may be caused by: Germs that are spread through sexual contact. This is the leading cause of urethritis. This may include bacterial or viral infections. Injury to the urethra. This can happen after a thin, flexible tube (catheter) is inserted into the urethra to drain urine, or after medical instruments or foreign bodies are inserted into the area. Chemical irritation. This may include contact with spermicide or prolonged contact with chemicals in bubble  bath, shampoo, or perfumed soaps. A disease that causes inflammation. This is rare. What increases the risk? The following factors may make you more likely to develop this condition: Having sex without using a condom. Having multiple sexual partners. Having poor hygiene. What are the signs or symptoms? Symptoms of this condition include: Pain with urination. Frequent urination. An urgent need to urinate. Itching and pain in the vagina or penis. Discharge or bleeding coming from the penis. Most women have no symptoms. How is this diagnosed? This condition may be diagnosed based on: Your symptoms. Your medical history. A physical exam. Tests may also be done. These may include: Urine tests. Swabs from the urethra. How is this treated? Treatment for this condition depends on the cause. Urethritis caused by a bacterial infection is treated with antibiotic medicine. Sexual partners must also be treated. Follow these instructions at home: Medicines Take over-the-counter and prescription medicines only as told by your health care provider. If you were prescribed an antibiotic, take it as told by your health care provider. Do not stop taking the antibiotic even if you start to feel better. Lifestyle Avoid using perfumed soaps, bubble bath, and shampoo when you bathe or shower.  Rinse the vaginal area after bathing. Wear cotton underwear. Not wearing underwear when going to sleep can help. Make sure to wipe from front to back after using the toilet if you are male. Do not have sex until your health care provider approves. When you do have sex, be sure to practice safe sex. Any sexual partners you have had in the past 60 days should be treated. General instructions Drink enough fluid to keep your urine pale yellow. It is up to you to get your test results. Ask your health care provider, or the department that is doing the test, when your results will be ready. Keep all follow-up visits. This is important. Get tested again 3 months after treatment to make sure the infection is gone. It is important that your sexual partner also gets tested again. Contact a health care provider if: Your symptoms have not improved after 3 days. Your symptoms get worse. You have eye redness or pain. You develop abdominal pain or pelvic pain (in females). You develop joint pain or a rash. You have a fever or chills. Get help right away if: You have severe pain in the belly, back, or side. You vomit repeatedly. Summary Urethritis is a swelling (inflammation) of the urethra. Germs that are spread through sexual contact are the most common cause of this condition. It is important to get treatment for this condition early. Delayed treatment may lead to complications. Treatment for this condition depends on the cause. Any sexual partners must also be treated. This information is not intended to replace advice given to you by your health care provider. Make sure you discuss any questions you have with your health care provider. Document Revised: 09/03/2019 Document Reviewed: 09/03/2019 Elsevier Patient Education  2024 [employer].       Signed,   Meredith Staggers, MD [facility], [facility] 10/01/22 9:46 AM

## 2022-10-02 LAB — PSA: PSA: 2.45 ng/mL (ref 0.10–4.00)

## 2022-10-03 LAB — URINE CULTURE
MICRO NUMBER:: 15374119
Result:: NO GROWTH
SPECIMEN QUALITY:: ADEQUATE

## 2022-10-03 LAB — RPR: RPR Ser Ql: NONREACTIVE

## 2022-10-03 LAB — HIV ANTIBODY (ROUTINE TESTING W REFLEX): HIV 1&2 Ab, 4th Generation: NONREACTIVE

## 2022-10-05 LAB — URINE CYTOLOGY ANCILLARY ONLY
Chlamydia: NEGATIVE
Comment: NEGATIVE
Comment: NEGATIVE
Comment: NORMAL
Neisseria Gonorrhea: NEGATIVE
Trichomonas: NEGATIVE

## 2022-12-02 ENCOUNTER — Other Ambulatory Visit: Payer: Self-pay | Admitting: Family Medicine

## 2022-12-02 ENCOUNTER — Ambulatory Visit: Payer: 59 | Admitting: Family Medicine

## 2022-12-02 VITALS — BP 126/64 | HR 87 | Temp 98.1°F | Ht 73.0 in | Wt 164.0 lb

## 2022-12-02 DIAGNOSIS — N410 Acute prostatitis: Secondary | ICD-10-CM

## 2022-12-02 DIAGNOSIS — R339 Retention of urine, unspecified: Secondary | ICD-10-CM | POA: Diagnosis not present

## 2022-12-02 DIAGNOSIS — R300 Dysuria: Secondary | ICD-10-CM | POA: Diagnosis not present

## 2022-12-02 DIAGNOSIS — R350 Frequency of micturition: Secondary | ICD-10-CM | POA: Diagnosis not present

## 2022-12-02 LAB — POCT URINALYSIS DIP (MANUAL ENTRY)
Bilirubin, UA: NEGATIVE
Blood, UA: NEGATIVE
Glucose, UA: NEGATIVE mg/dL
Ketones, POC UA: NEGATIVE mg/dL
Nitrite, UA: NEGATIVE
Protein Ur, POC: NEGATIVE mg/dL
Spec Grav, UA: 1.015 (ref 1.010–1.025)
Urobilinogen, UA: 0.2 U/dL
pH, UA: 7 (ref 5.0–8.0)

## 2022-12-02 LAB — PSA: PSA: 7.29 ng/mL — ABNORMAL HIGH (ref 0.10–4.00)

## 2022-12-02 MED ORDER — SULFAMETHOXAZOLE-TRIMETHOPRIM 800-160 MG PO TABS: 1.0000 | ORAL_TABLET | Freq: Two times a day (BID) | ORAL | 0 refills | Status: DC

## 2022-12-02 NOTE — Patient Instructions (Signed)
I will repeat the prostate test and urine test today but I am also referring you to urology.  I do not recommend any new medications for now as previous testing did not indicate specific infection, and prior antibiotic should have treated for nongonococcal urethritis.  If any concerns on your PSA or repeat urine test, I will let you know if additional medication needed.  Let me know if there are questions and if you have not heard from urology in the next week or 2.  Hang in there.  Urinary Frequency, Adult Urinary frequency means urinating more often than usual. You may urinate every 1-2 hours even though you drink a normal amount of fluid and do not have a bladder infection or condition. Although you urinate more often than normal, the total amount of urine produced in a day is normal. With urinary frequency, you may have an urgent need to urinate often. The stress and anxiety of needing to find a bathroom quickly can make this urge worse. This condition may go away on its own, or you may need treatment at home. Home treatment may include bladder training, exercises, taking medicines, or making changes to your diet. Follow these instructions at home: Bladder health Your health care provider will tell you what to do to improve bladder health. You may be told to: Keep a bladder diary. Keep track of: What you eat and drink. How often you urinate. How much you urinate. Follow a bladder training program. This may include: Learning to delay going to the bathroom. Double urinating, also called voiding. This helps if you are not completely emptying your bladder. Scheduled voiding. Do Kegel exercises. Kegel exercises strengthen the muscles that help control urination, which may help the condition.  Eating and drinking Follow instructions from your health care provider about eating or drinking restrictions. You may be told to: Avoid caffeine. Drink fewer fluids, especially alcohol. Avoid drinking in  the evening. Avoid foods or drinks that may irritate the bladder. These include coffee, tea, soda, artificial sweeteners, citrus, tomato-based foods, and chocolate. Eat foods that help prevent or treat constipation. Constipation can make urinary frequency worse. You may need to take these actions to prevent or treat constipation: Drink enough fluid to keep your urine pale yellow. Take over-the-counter or prescription medicines. Eat foods that are high in fiber, such as beans, whole grains, and fresh fruits and vegetables. Limit foods that are high in fat and processed sugars, such as fried or sweet foods. General instructions Take over-the-counter and prescription medicines only as told by your health care provider. Keep all follow-up visits. This is important. Contact a health care provider if: You start urinating more often. You feel pain or irritation when you urinate. You notice blood in your urine. Your urine looks cloudy. You develop a fever. You begin vomiting. Get help right away if: You are unable to urinate. Summary Urinary frequency means urinating more often than usual. With urinary frequency, you may urinate every 1-2 hours even though you drink a normal amount of fluid and do not have a bladder infection or other bladder condition. Your health care provider may recommend that you keep a bladder diary, follow a bladder training program, or make dietary changes. If told by your health care provider, do Kegel exercises to strengthen the muscles that help control urination. Take over-the-counter and prescription medicines only as told by your health care provider. Contact a health care provider if your symptoms do not improve or get worse. This information  is not intended to replace advice given to you by your health care provider. Make sure you discuss any questions you have with your health care provider. Document Revised: 09/01/2019 Document Reviewed: 09/01/2019 Elsevier  Patient Education  2024 [employer].

## 2022-12-02 NOTE — Addendum Note (Signed)
Addended by: Meredith Staggers R on: 12/02/2022 02:04 PM   Modules accepted: Orders

## 2022-12-02 NOTE — Progress Notes (Signed)
Subjective:  Patient ID: Vincent Case, male    DOB: Jul 03, 1965  Age: 57 y.o. MRN: [id number]  CC:  Chief Complaint  Patient presents with   Results    Discuss results from last visit, pt notes everything is the same at this time no new or worsening symptoms     HPI Cajun Bayers presents for   Dysuria/urinary frequency Discussed in August.  Minor irritation with burning starting 5 weeks prior at that time with frequent urination and urgency.  Treated with doxycycline for suspected infective urethritis.STI screening with HIV testing, chlamydia gonorrhea and syphilis testing were all nonreactive, negative.  Urine culture no growth.  PSA 2.45, up from 1.8 to 1-year prior.  Still intermittent symptoms - few days per week. Sometimes minutes or hours. Mild burning in penis. Some increased frequency. Incomplete emptying at times.  No new sexual contacts since last testing. Prior new contact about 6 weeks prior. No change in sx's with antibiotic - possibly improved first few days, then same.  No fever/abd pain.  No testicular pain.  No hematuria.   Urine test a few weeks ago at work physical.  Urology eval last year - Dr. Annabell Howells, renal cyst, calculus.   Labs from his phone:  Labs performed 11/18/2022.  Total cholesterol 158, triglyceride 55, HDL 90 LDL 57.  Glucose 106 but otherwise CMP normal.  A1c 5.0.  TSH 1.85.  Hemoglobin 16.4 with an MCV of 99 but otherwise normal CBC.  Urinalysis with spec gravity 1.015.  Negative nitrite, protein, glucose, blood and leukocyte esterase.  0-5 white blood cells on micro, no RBCs.   History Patient Active Problem List   Diagnosis Date Noted   Hypertension 12/08/2019   Hyperlipidemia 12/08/2019   Family history of early CAD 12/08/2019   Past Medical History:  Diagnosis Date   Hyperlipidemia    Hypertension    Past Surgical History:  Procedure Laterality Date   FRACTURE SURGERY  1984   No Known Allergies Prior to Admission medications    Medication Sig Start Date End Date Taking? Authorizing Provider  acyclovir (ZOVIRAX) 400 MG tablet Take 1 tablet (400 mg total) by mouth daily. 12/25/21   Shade Flood, MD  amLODipine-benazepril (LOTREL) 5-20 MG capsule Take 1 capsule by mouth daily. 07/27/22   Shade Flood, MD  doxycycline (VIBRA-TABS) 100 MG tablet Take 1 tablet (100 mg total) by mouth 2 (two) times daily. 10/01/22   Shade Flood, MD  Multiple Vitamins-Minerals (MENS MULTIVITAMIN PO) Take by mouth.    [provider]  rosuvastatin (CRESTOR) 20 MG tablet Take 1 tablet (20 mg total) by mouth daily. 07/27/22   Shade Flood, MD   Social History   Socioeconomic History   Marital status: Married    Spouse name: Not on file   Number of children: Not on file   Years of education: Not on file   Highest education level: Some college, no degree  Occupational History   Not on file  Tobacco Use   Smoking status: Never   Smokeless tobacco: Never  Vaping Use   Vaping status: Never Used  Substance and Sexual Activity   Alcohol use: Yes   Drug use: Never   Sexual activity: Yes    Birth control/protection: None  Other Topics Concern   Not on file  Social History Narrative   Not on file   Social Determinants of Health   Financial Resource Strain: Patient Declined (12/02/2022)   Overall Financial Resource Strain (CARDIA)  Difficulty of Paying Living Expenses: Patient declined  Food Insecurity: Patient Declined (12/02/2022)   Hunger Vital Sign    Worried About Running Out of Food in the Last Year: Patient declined    Ran Out of Food in the Last Year: Patient declined  Transportation Needs: No Transportation Needs (12/02/2022)   PRAPARE - Administrator, Civil Service (Medical): No    Lack of Transportation (Non-Medical): No  Physical Activity: Sufficiently Active (12/02/2022)   Exercise Vital Sign    Days of Exercise per Week: 3 days    Minutes of Exercise per Session: 50 min   Stress: No Stress Concern Present (12/02/2022)   [employer] of Occupational Health - Occupational Stress Questionnaire    Feeling of Stress : Not at all  Social Connections: Unknown (12/02/2022)   Social Connection and Isolation Panel [NHANES]    Frequency of Communication with Friends and Family: Patient declined    Frequency of Social Gatherings with Friends and Family: Patient declined    Attends Religious Services: Patient declined    Database administrator or Organizations: Patient declined    Attends Banker Meetings: Not on file    Marital Status: Patient declined  Intimate Partner Violence: Not on file    Review of Systems Per HPI.   Objective:   Vitals:   12/02/22 0823  BP: 126/64  Pulse: 87  Temp: 98.1 F (36.7 C)  TempSrc: Temporal  SpO2: 99%  Weight: 164 lb (74.4 kg)  Height: 6\' 1"  (1.854 m)     Physical Exam Vitals reviewed.  Constitutional:      Appearance: He is well-developed.  HENT:     Head: Normocephalic and atraumatic.  Neck:     Vascular: No carotid bruit or JVD.  Cardiovascular:     Rate and Rhythm: Normal rate and regular rhythm.     Heart sounds: Normal heart sounds. No murmur heard. Pulmonary:     Effort: Pulmonary effort is normal.     Breath sounds: Normal breath sounds. No rales.  Abdominal:     General: There is no distension.     Tenderness: There is no abdominal tenderness. There is no right CVA tenderness, left CVA tenderness or guarding.  Musculoskeletal:     Right lower leg: No edema.     Left lower leg: No edema.  Skin:    General: Skin is warm and dry.  Neurological:     Mental Status: He is alert and oriented to person, place, and time.  Psychiatric:        Mood and Affect: Mood normal.      Results for orders placed or performed in visit on 12/02/22  POCT urinalysis dipstick  Result Value Ref Range   Color, UA straw (A) yellow   Clarity, UA clear clear   Glucose, UA negative negative mg/dL    Bilirubin, UA negative negative   Ketones, POC UA negative negative mg/dL   Spec Grav, UA 6.045 4.098 - 1.025   Blood, UA negative negative   pH, UA 7.0 5.0 - 8.0   Protein Ur, POC negative negative mg/dL   Urobilinogen, UA 0.2 0.2 or 1.0 E.U./dL   Nitrite, UA Negative Negative   Leukocytes, UA Trace (A) Negative     Assessment & Plan:  Nijee Frisbey is a 57 y.o. male . Urinary frequency - Plan: PSA, POCT urinalysis dipstick, Ambulatory referral to Urology  Dysuria - Plan: PSA, POCT urinalysis dipstick, Ambulatory referral to Urology  Incomplete emptying of bladder - Plan: PSA, POCT urinalysis dipstick, Ambulatory referral to Urology  Intermittent but persistent mild symptoms as above with slight dysuria, incomplete emptying of bladder and frequency.  Previous testing reassuring including STI screening, urine culture and point-of-care urinalysis.  Repeat point-of-care urinalysis from October 9 noted from his physical which was also overall reassuring.  He did have a slight increase in his most recent PSA compared to prior but still within normal range.  With minimal change in antibiotics I am hesitant to restart antibiotics at this time especially as prior treatment for NGU with doxycycline.  Repeat PSA, urinalysis and refer to urology with RTC precautions.  No orders of the defined types were placed in this encounter.  Patient Instructions  I will repeat the prostate test and urine test today but I am also referring you to urology.  I do not recommend any new medications for now as previous testing did not indicate specific infection, and prior antibiotic should have treated for nongonococcal urethritis.  If any concerns on your PSA or repeat urine test, I will let you know if additional medication needed.  Let me know if there are questions and if you have not heard from urology in the next week or 2.  Hang in there.  Urinary Frequency, Adult Urinary frequency means urinating more  often than usual. You may urinate every 1-2 hours even though you drink a normal amount of fluid and do not have a bladder infection or condition. Although you urinate more often than normal, the total amount of urine produced in a day is normal. With urinary frequency, you may have an urgent need to urinate often. The stress and anxiety of needing to find a bathroom quickly can make this urge worse. This condition may go away on its own, or you may need treatment at home. Home treatment may include bladder training, exercises, taking medicines, or making changes to your diet. Follow these instructions at home: Bladder health Your health care provider will tell you what to do to improve bladder health. You may be told to: Keep a bladder diary. Keep track of: What you eat and drink. How often you urinate. How much you urinate. Follow a bladder training program. This may include: Learning to delay going to the bathroom. Double urinating, also called voiding. This helps if you are not completely emptying your bladder. Scheduled voiding. Do Kegel exercises. Kegel exercises strengthen the muscles that help control urination, which may help the condition.  Eating and drinking Follow instructions from your health care provider about eating or drinking restrictions. You may be told to: Avoid caffeine. Drink fewer fluids, especially alcohol. Avoid drinking in the evening. Avoid foods or drinks that may irritate the bladder. These include coffee, tea, soda, artificial sweeteners, citrus, tomato-based foods, and chocolate. Eat foods that help prevent or treat constipation. Constipation can make urinary frequency worse. You may need to take these actions to prevent or treat constipation: Drink enough fluid to keep your urine pale yellow. Take over-the-counter or prescription medicines. Eat foods that are high in fiber, such as beans, whole grains, and fresh fruits and vegetables. Limit foods that are  high in fat and processed sugars, such as fried or sweet foods. General instructions Take over-the-counter and prescription medicines only as told by your health care provider. Keep all follow-up visits. This is important. Contact a health care provider if: You start urinating more often. You feel pain or irritation when you urinate. You notice blood  in your urine. Your urine looks cloudy. You develop a fever. You begin vomiting. Get help right away if: You are unable to urinate. Summary Urinary frequency means urinating more often than usual. With urinary frequency, you may urinate every 1-2 hours even though you drink a normal amount of fluid and do not have a bladder infection or other bladder condition. Your health care provider may recommend that you keep a bladder diary, follow a bladder training program, or make dietary changes. If told by your health care provider, do Kegel exercises to strengthen the muscles that help control urination. Take over-the-counter and prescription medicines only as told by your health care provider. Contact a health care provider if your symptoms do not improve or get worse. This information is not intended to replace advice given to you by your health care provider. Make sure you discuss any questions you have with your health care provider. Document Revised: 09/01/2019 Document Reviewed: 09/01/2019 Elsevier Patient Education  2024 Elsevier Inc.     Signed,   Meredith Staggers, MD [facility] 12/02/22 8:53 AM

## 2022-12-03 ENCOUNTER — Telehealth: Payer: Self-pay

## 2022-12-03 IMAGING — US US ABDOMEN COMPLETE
1 series · 14 of 25 positions shown · non-contrast
Comparison: None.

CLINICAL DATA: Right lower quadrant pain

EXAM:
ABDOMEN ULTRASOUND COMPLETE

[Series 1: us abdomen complete · 0.23mm/px · 14 of 74 slices shown]
[im 1/74]
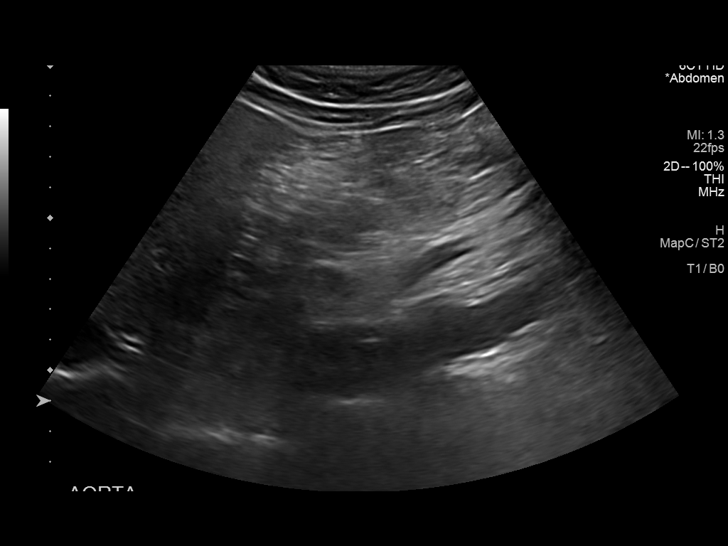
[im 7/74]
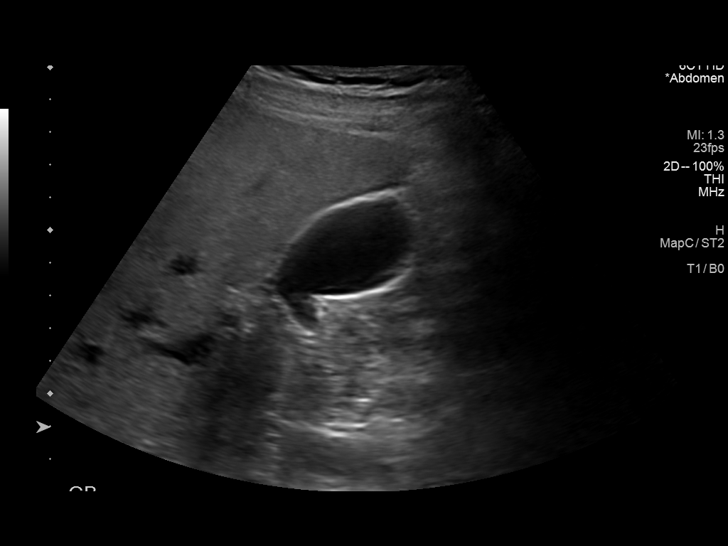
[im 13/74]
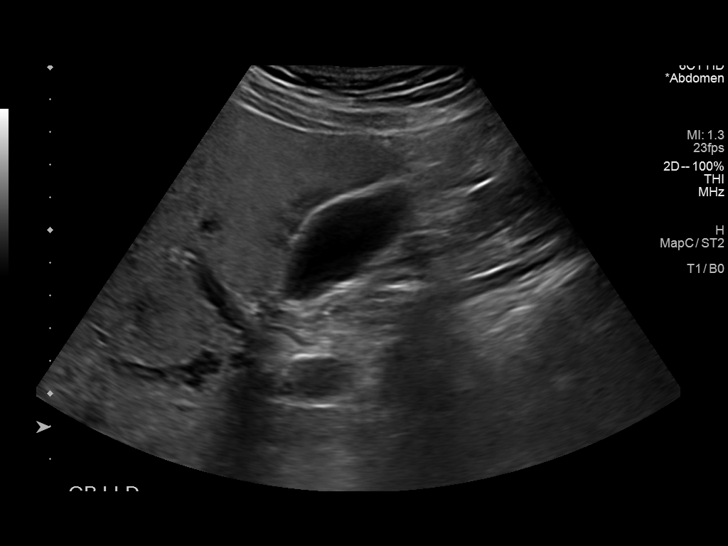
[im 19/74]
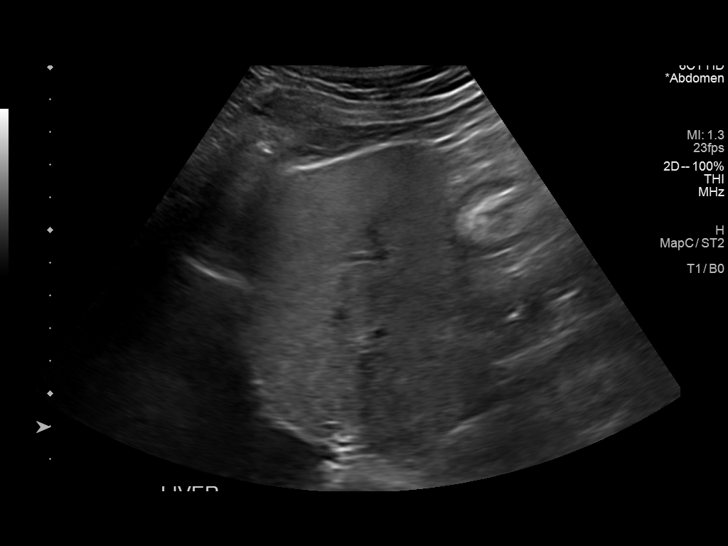
[im 25/74]
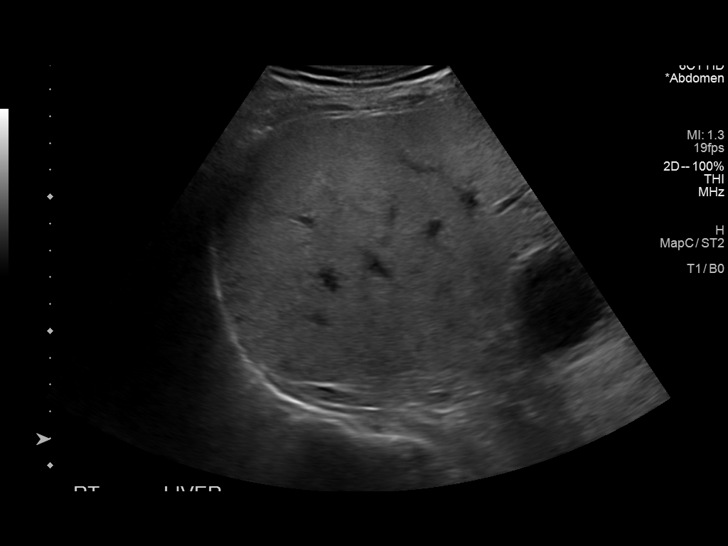
[im 28/74]
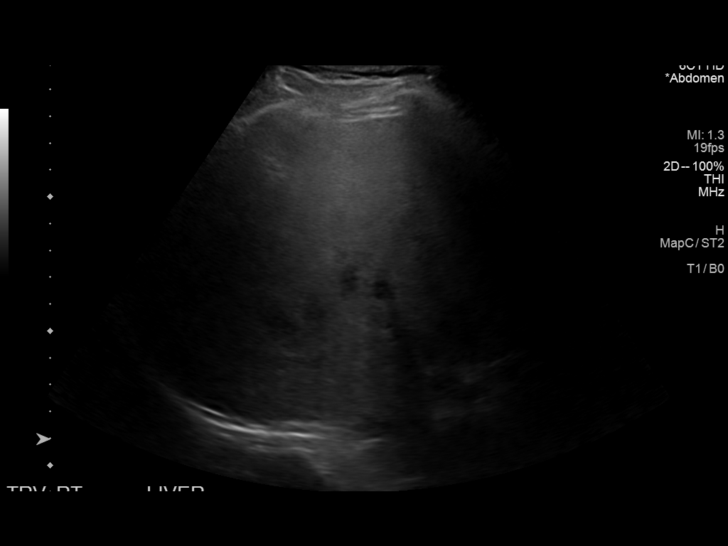
[im 34/74]
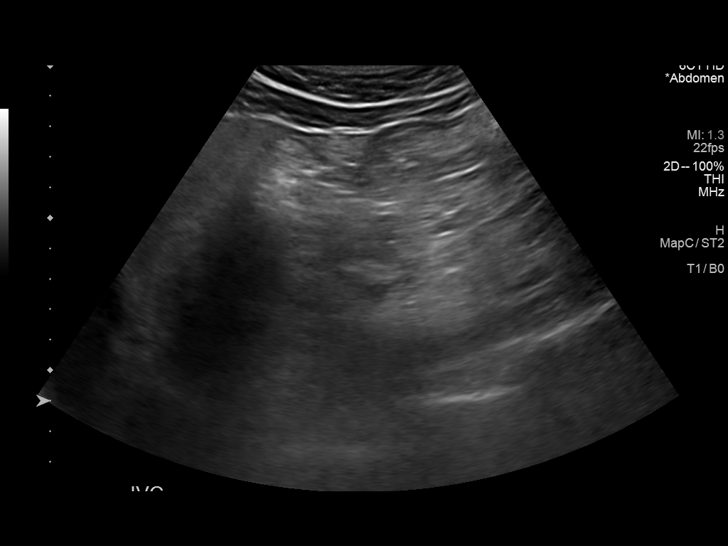
[im 40/74]
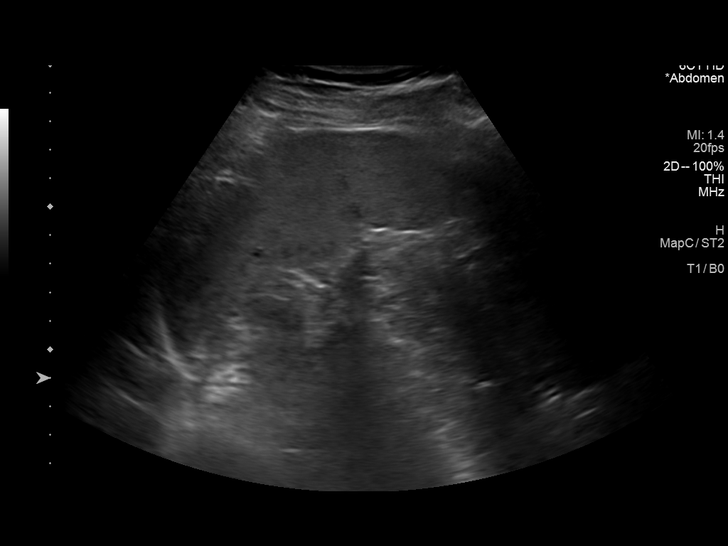
[im 46/74]
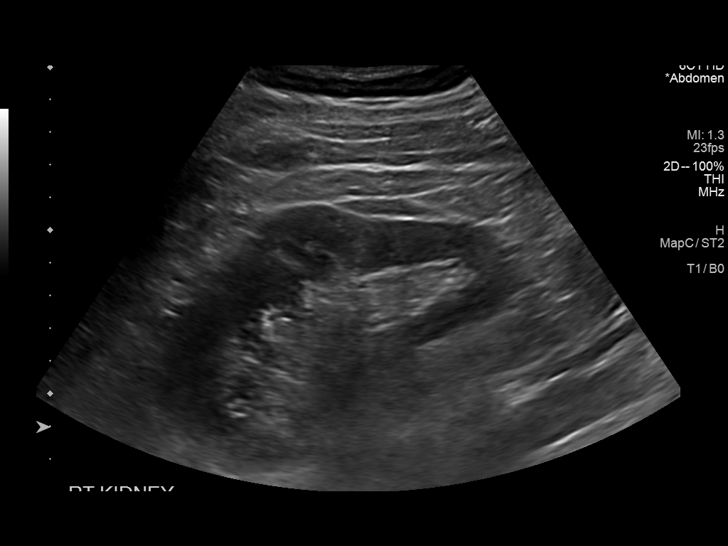
[im 49/74]
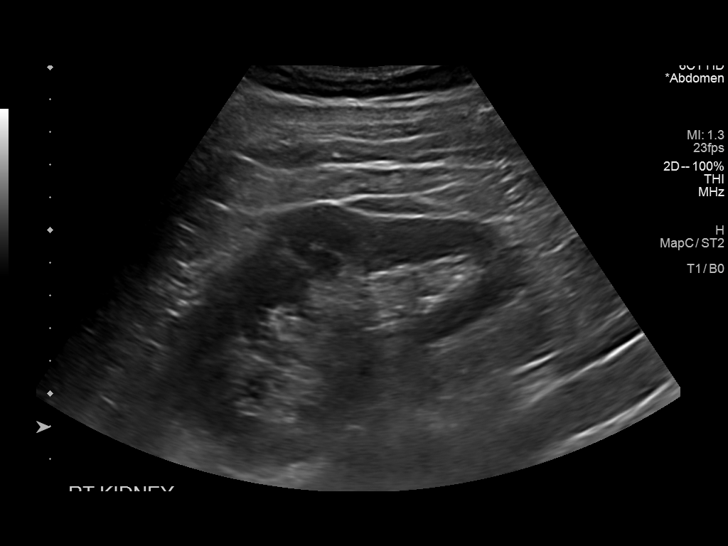
[im 55/74]
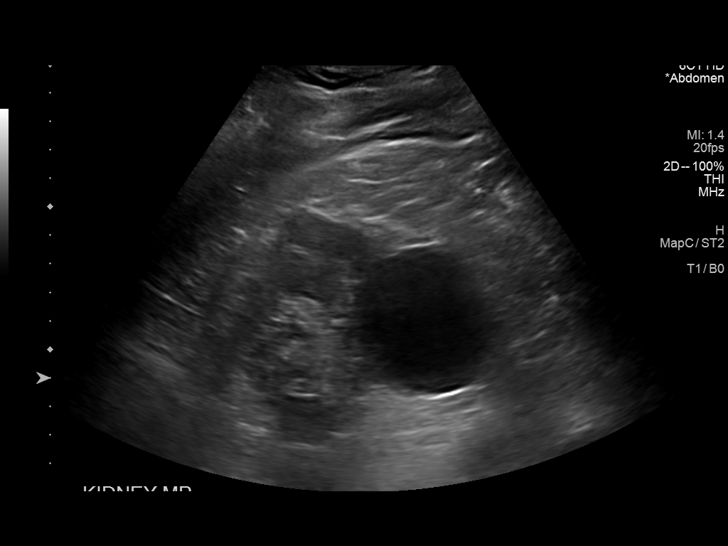
[im 61/74]
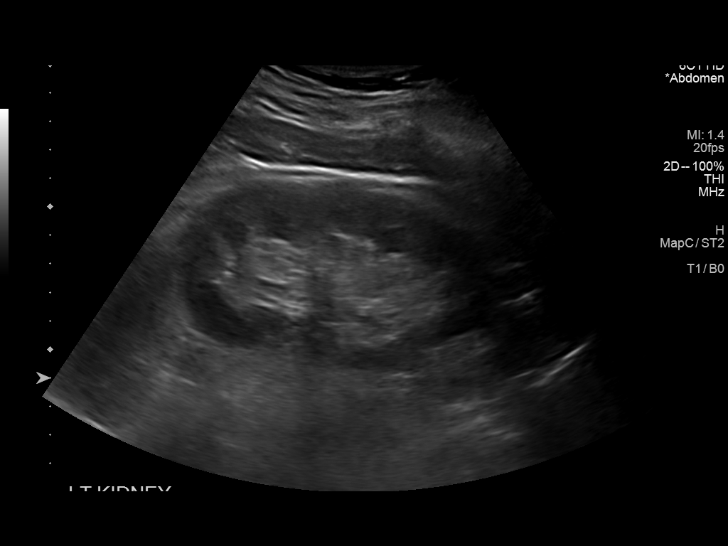
[im 67/74]
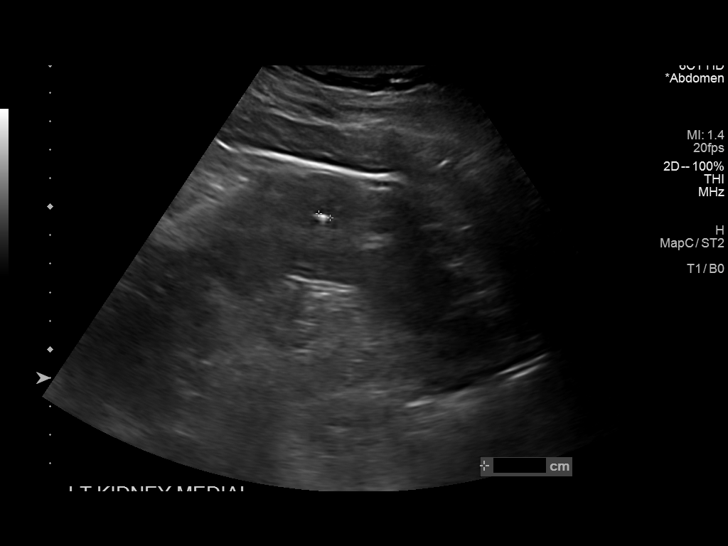
[im 74/74]
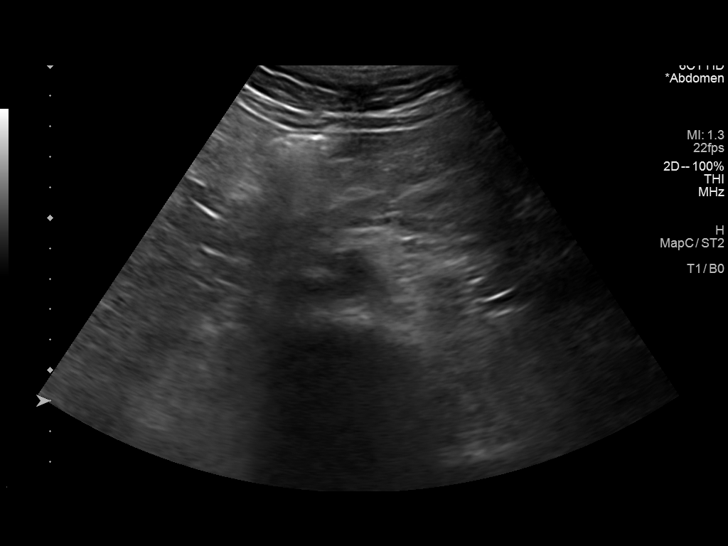

[14 of 25 positions shown; findings below may reference images not displayed]

FINDINGS: Gallbladder: No gallstones or wall thickening visualized. No
sonographic Murphy sign noted by sonographer.

Common bile duct: Diameter: 2.9 mm

Liver: Diffusely echogenic. No focal hepatic abnormality. Portal
vein is patent on color Doppler imaging with normal direction of
blood flow towards the liver.

IVC: No abnormality visualized.

Pancreas: Visualized portion unremarkable.

Spleen: Size and appearance within normal limits.

Right Kidney: Length: 11.4 cm. Echogenicity within normal limits. No
hydronephrosis. Cyst at the midpole measuring 4.9 x 4.4 x 4.9 cm.

Left Kidney: Length: 11.5 cm. Echogenicity normal. No mass or
hydronephrosis. Small stone at the midpole measuring 4 mm

Abdominal aorta: No aneurysm visualized.

Other findings: None.
IMPRESSION: 1. Negative for gallstones.
2. Echogenic liver consistent with hepatic steatosis and or
hepatocellular disease
3. Probable small left kidney stone

## 2022-12-03 NOTE — Telephone Encounter (Signed)
-----   Message from Shade Flood sent at 12/02/2022  5:37 PM EDT ----- Jeanene Erb to discuss results, generic voicemail message.  Specifics were not left.  Advised that I will send a MyChart message and have staff call to verify receipt tomorrow.    Prior doxycycline with minimal change in symptoms.  Will start on Septra for suspected prostatitis as main cause of symptoms with elevated PSA and leukocytes noted on urinalysis.  Continue follow-up with urology as planned.  Urine culture pending.  Please call patient in the morning and make sure he received results and plan.  Let me know if there are questions.

## 2022-12-08 NOTE — Progress Notes (Signed)
Patient has been informed and will call back if symptoms continue or do not improve further

## 2022-12-08 NOTE — Addendum Note (Signed)
Addended by: Eldred Manges on: 12/08/2022 11:18 AM   Modules accepted: Orders

## 2022-12-10 ENCOUNTER — Telehealth: Payer: Self-pay | Admitting: Family Medicine

## 2022-12-10 MED ORDER — ONDANSETRON HCL 4 MG PO TABS: 4.0000 mg | ORAL_TABLET | Freq: Three times a day (TID) | ORAL | 0 refills | Status: DC | PRN

## 2022-12-10 NOTE — Telephone Encounter (Signed)
Called patient.  Halfway through antibiotic. Doing well overall. Slight nausea at times that quickly passed in less than 5 minutes about an hour after abx most of the times taken.  Today has had persistent nausea, with few episodes of vomiting. Last episode ago.  No hematemesis. No sick contacts. Had fish last night - may have made himn sick.   No back pain, fever, or abd pain.  Able to sip water, urinating normally today. Urination has been back to 99% normal.   Possible foodborne illness versus side effects from Septra.  Zofran ordered for now.  Update on symptoms tomorrow morning.  If any further nausea with next dose of Septra, stop and will prescribe quinolone in its place.  He will give me an update tomorrow.  Without fever, abdominal pain, back pain or change in urinary symptoms we will hold on further testing at this time.

## 2022-12-10 NOTE — Telephone Encounter (Signed)
Caller name: Venice Ruyle  On DPR?: Yes  Call back number: [phone]  Provider they see: Shade Flood, MD  Reason for call:   Yesterday pt states he's doing very well. But today pt doing bad been sick all day been in bed. Not sure he can take this next dose. -Advise

## 2022-12-11 ENCOUNTER — Telehealth: Payer: Self-pay

## 2022-12-11 NOTE — Telephone Encounter (Signed)
Good news  Thanks for the update

## 2022-12-11 NOTE — Telephone Encounter (Signed)
Opened in error

## 2022-12-11 NOTE — Telephone Encounter (Signed)
Pt took his dose this am and did notify have any issues

## 2022-12-16 ENCOUNTER — Telehealth (INDEPENDENT_AMBULATORY_CARE_PROVIDER_SITE_OTHER): Payer: 59 | Admitting: Family Medicine

## 2022-12-16 DIAGNOSIS — R300 Dysuria: Secondary | ICD-10-CM

## 2022-12-16 DIAGNOSIS — N410 Acute prostatitis: Secondary | ICD-10-CM

## 2022-12-16 NOTE — Telephone Encounter (Signed)
Pt states he is going to take Benadryl to see if this helps. Pt has appt to be seen tomorrow with Dr.Tabori

## 2022-12-16 NOTE — Telephone Encounter (Signed)
Advise to medication. Pt reports he did not notice until.

## 2022-12-16 NOTE — Telephone Encounter (Signed)
Caller name: Eva Vallee  On DPR?: Yes  Call back number: [phone] (mobile)  Provider they see: Shade Flood, MD  Reason for call:   Pt states he's doing 80% better - last day of meds asking if Neva Seat would like to extend med?

## 2022-12-16 NOTE — Telephone Encounter (Signed)
Pt called back to he noticed this morning that he has a skin rash, little red dots all over chest and stomach area, one of the possible side effects listed of the antibiotic he is taking. He wants to know if he should stop taking it or come in or what his next step should be

## 2022-12-17 ENCOUNTER — Ambulatory Visit: Payer: 59 | Admitting: Family Medicine

## 2022-12-17 ENCOUNTER — Encounter: Payer: Self-pay | Admitting: Family Medicine

## 2022-12-17 VITALS — BP 108/58 | HR 83 | Temp 98.5°F | Ht 73.0 in | Wt 157.0 lb

## 2022-12-17 DIAGNOSIS — N410 Acute prostatitis: Secondary | ICD-10-CM | POA: Diagnosis not present

## 2022-12-17 DIAGNOSIS — L270 Generalized skin eruption due to drugs and medicaments taken internally: Secondary | ICD-10-CM

## 2022-12-17 DIAGNOSIS — G5712 Meralgia paresthetica, left lower limb: Secondary | ICD-10-CM

## 2022-12-17 MED ORDER — CIPROFLOXACIN HCL 500 MG PO TABS: 500.0000 mg | ORAL_TABLET | Freq: Two times a day (BID) | ORAL | 0 refills | Status: DC

## 2022-12-17 MED ORDER — PREDNISONE 10 MG PO TABS: ORAL_TABLET | ORAL | 0 refills | Status: DC

## 2022-12-17 NOTE — Progress Notes (Signed)
   Subjective:    Patient ID: Vincent Case, male    DOB: May 13, 1965, 57 y.o.   MRN: [id number]  HPI Rash- first noticed yesterday after taking a shower.  Does not itch or hurt.  Pt is currently on Bactrim- had 2 pills left.    Prostatitis- pt reports he will still have urgency, pain, change in urine flow.  Sxs have improved since taking Doxy and then Bactrim but still present at times.  L thigh numbness- new.  Localized.  First appeared a few days ago.  No change in activity level, no change in belts or pants.  Reports he was sitting in an awkward position for an extended period of time   Review of Systems For ROS see HPI     Objective:   Physical Exam Vitals reviewed.  Constitutional:      General: He is not in acute distress.    Appearance: Normal appearance. He is not ill-appearing.  HENT:     Head: Normocephalic and atraumatic.  Skin:    General: Skin is warm and dry.     Findings: Erythema and rash (erythematous, blanching rash that is coalescing on back, trunk, arms, legs) present.  Neurological:     Mental Status: He is alert and oriented to person, place, and time.     Sensory: Sensory deficit (numbness of L lateral thigh) present.     Coordination: Coordination normal.     Gait: Gait normal.  Psychiatric:        Mood and Affect: Mood normal.        Behavior: Behavior normal.        Thought Content: Thought content normal.           Assessment & Plan:  Drug rash- new.  Rash started yesterday.  Currently on Bactrim.  Denies pain or itching but rash is spreading.  Appearance consistent w/ drug rash.  Stop Bactrim.  Add to allergy list.  Start Prednisone to stop rash from worsening.  Pt expressed understanding and is in agreement w/ plan.   Prostatitis- ongoing.  Pt reports no change w/ Doxy, some improvement w/ Bactrim but now he has a drug rash.  Feels that he still has some urinary issues and discomfort.  Will start 1 week of Cipro- did discuss risk of tendon  rupture- and he can f/u w/ PCP if no better.  Pt expressed understanding and is in agreement w/ plan.   Meralgia paresthetica- new.  L sided.  Reviewed dx w/ pt and provided reassurance.  Most likely due to his awkward positioning for extended period of time.  Pt expressed understanding and is in agreement w/ plan.

## 2022-12-17 NOTE — Patient Instructions (Addendum)
Follow up as needed or as scheduled START the Cipro twice daily x7 days DO NOT finish the current antibiotic START the Prednisone as directed- 3 pills at the same time x3 days, then 2 pills at the same time x3 days, then 1 pill daily.  Take w/ food  Call with any questions or concerns Hang in there!!!

## 2022-12-22 IMAGING — CT CT ABD-PELV W/ CM
1 of 3 series · 13 of 32 positions shown, 18 images · IV contrast (agent unspecified)
Comparison: Ultrasound March 20, 2021

CLINICAL DATA: Right lower quadrant abdominal pain. Right renal
cysts.

EXAM:
CT ABDOMEN AND PELVIS WITH CONTRAST
TECHNIQUE: Multidetector CT imaging of the abdomen and pelvis was performed
using the standard protocol following bolus administration of
intravenous contrast.

[Series 2: a/p w/ 5mm · axial · 0.88mm/px · z∈[-516,-6]mm · 13 of 114 slices shown, 18 images]
[im 6/114  soft-tissue]
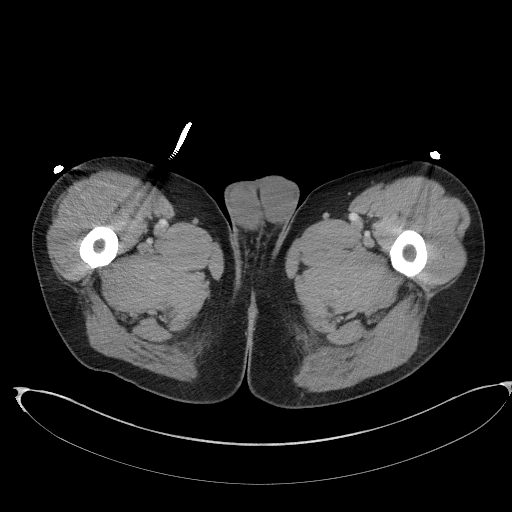
[im 6/114  bone]
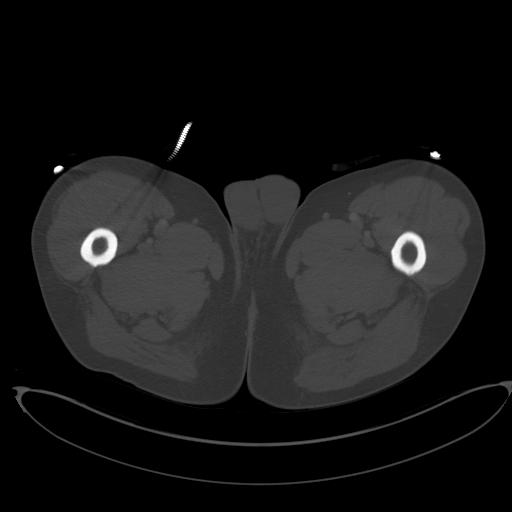
[im 17/114  soft-tissue]
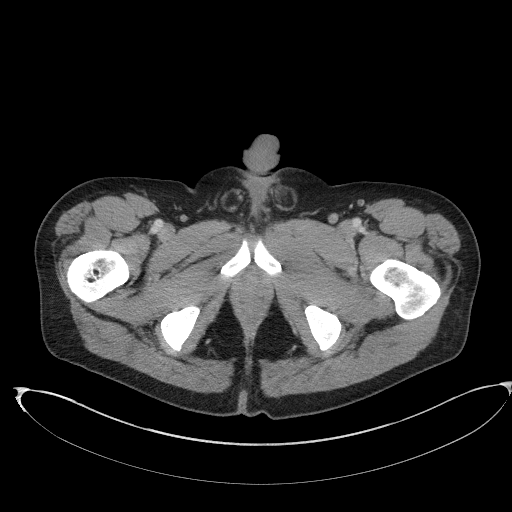
[im 23/114  soft-tissue]
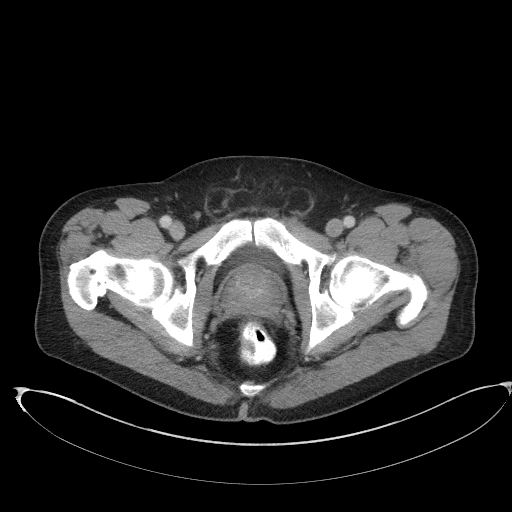
[im 34/114  soft-tissue]
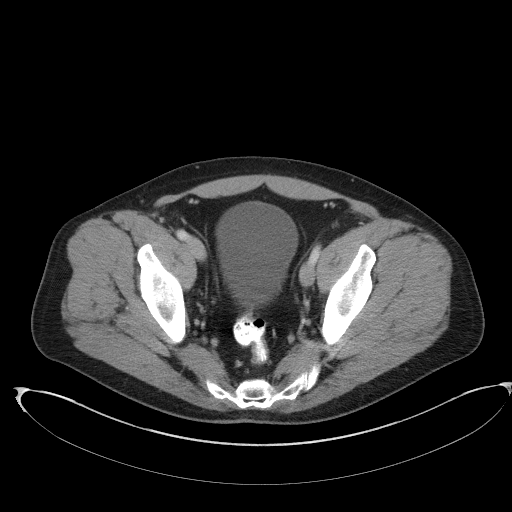
[im 46/114  soft-tissue]
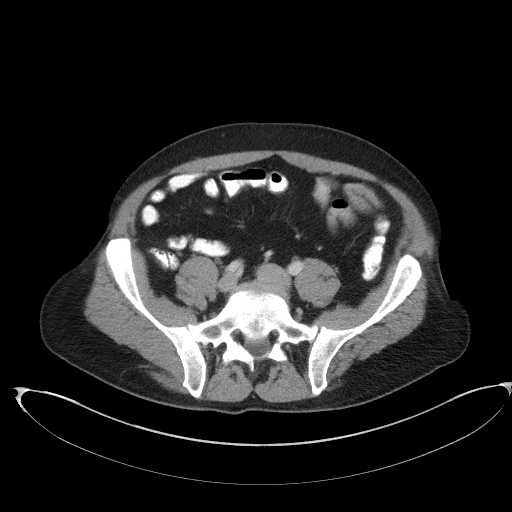
[im 51/114  soft-tissue]
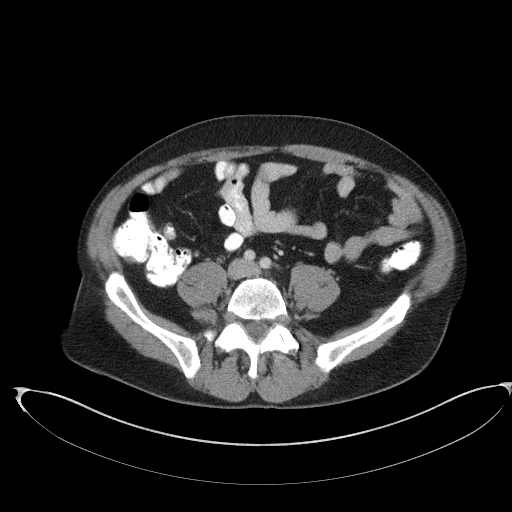
[im 63/114  soft-tissue]
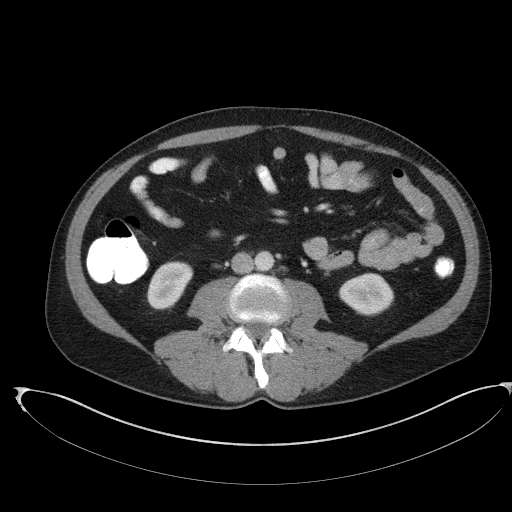
[im 68/114  soft-tissue]
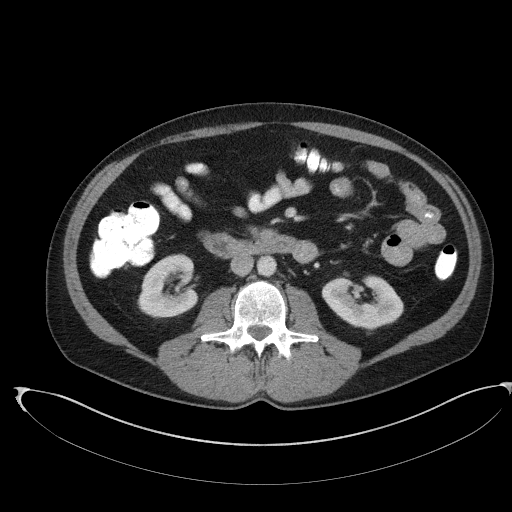
[im 80/114  soft-tissue]
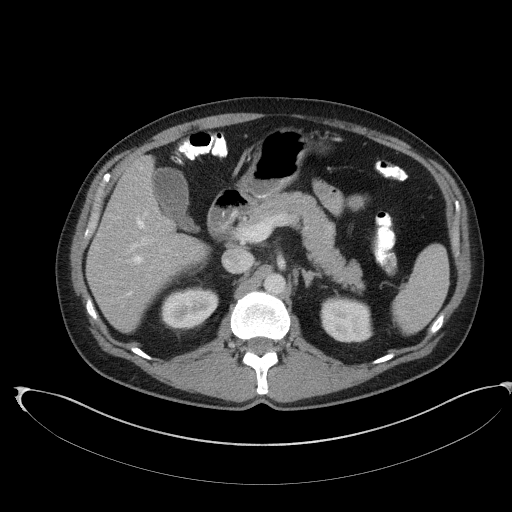
[im 80/114  bone]
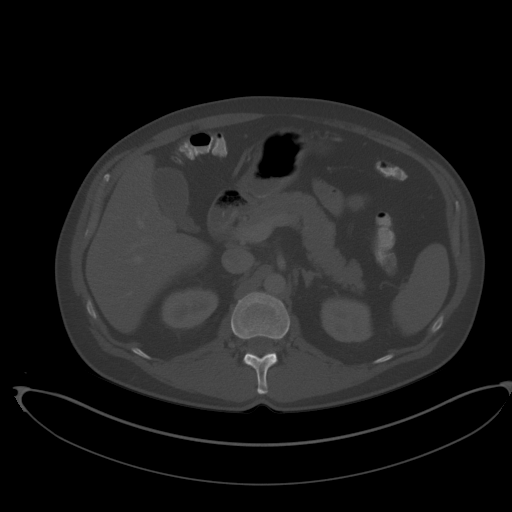
[im 91/114  soft-tissue]
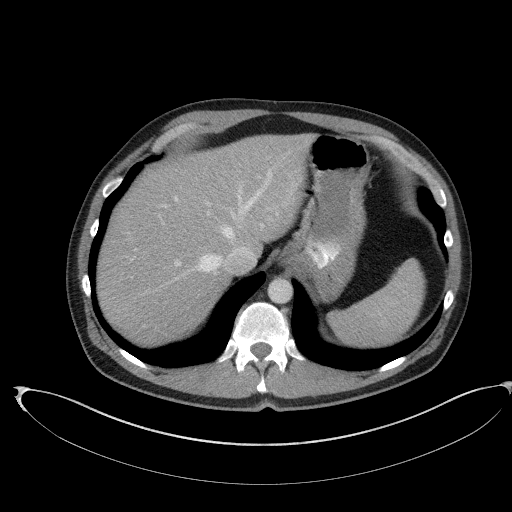
[im 91/114  lung]
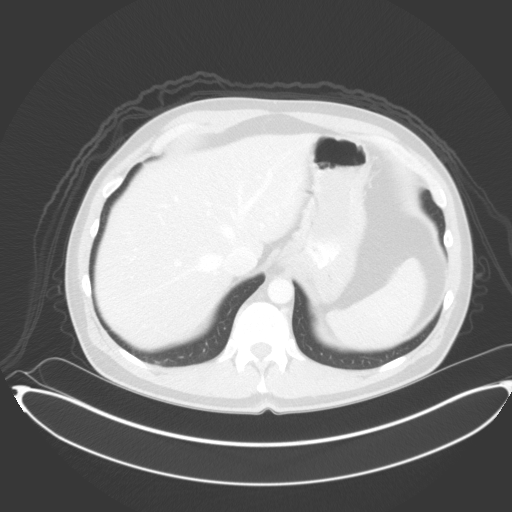
[im 97/114  soft-tissue]
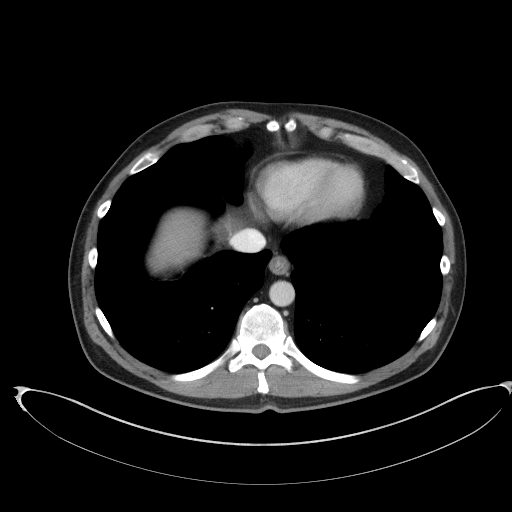
[im 97/114  lung]
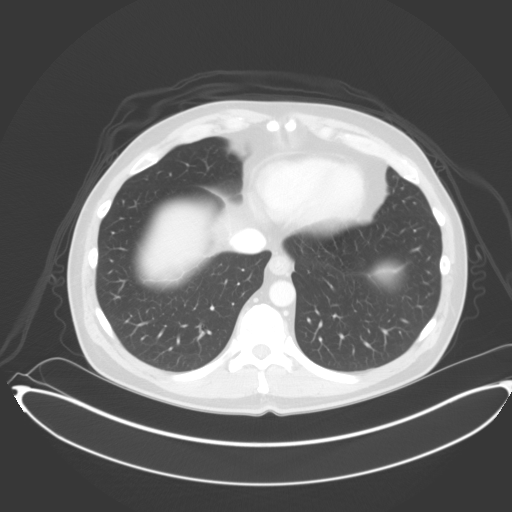
[im 102/114  lung]
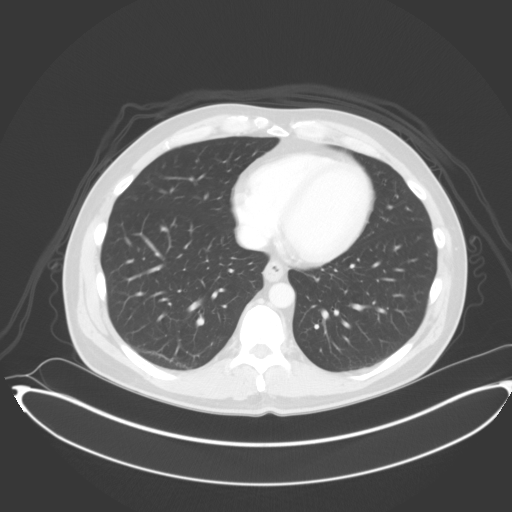
[im 108/114  soft-tissue]
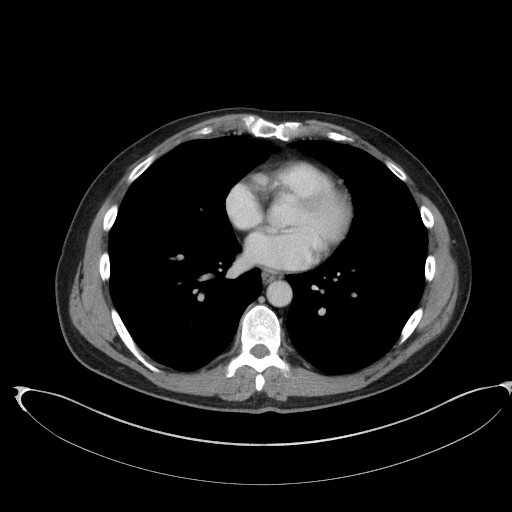
[im 108/114  lung]
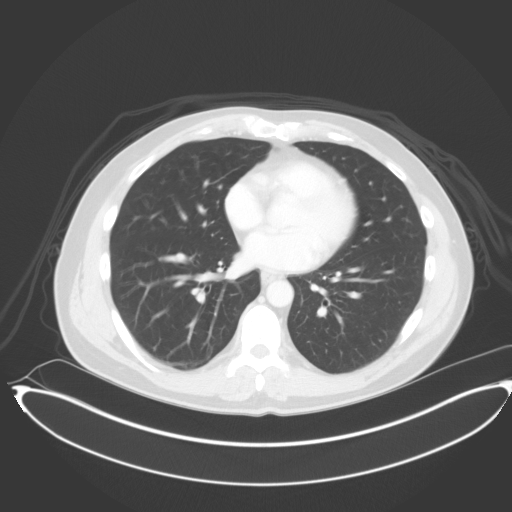

[13 of 32 positions shown; findings below may reference images not displayed]

RADIATION DOSE REDUCTION: This exam was performed according to the
departmental dose-optimization program which includes automated
exposure control, adjustment of the mA and/or kV according to
patient size and/or use of iterative reconstruction technique.

CONTRAST:  100mL VGJRPC-MRR IOPAMIDOL (VGJRPC-MRR) INJECTION 61%
FINDINGS: Lower chest: No acute abnormality.

Hepatobiliary: Probable hepatic steatosis. Gallbladder is
unremarkable. No biliary ductal dilation.

Pancreas: No pancreatic ductal dilation or evidence of acute
inflammation.

Spleen: No splenomegaly or focal splenic lesion.

Adrenals/Urinary Tract: Bilateral adrenal glands appear normal. No
hydronephrosis. Kidneys demonstrate symmetric enhancement and
excretion of contrast material. 5.1 cm right upper pole renal cyst.
No solid enhancing renal mass. Urinary bladder is effaced by an
enlarged prostate, otherwise unremarkable for degree of distension.

Stomach/Bowel: Radiopaque enteric contrast material traverses the
rectum. Stomach is unremarkable for degree of distension. No
pathologic dilation of small or large bowel. The appendix and
terminal ileum appear normal. No evidence of acute bowel
inflammation. No suspicious colonic wall thickening or mass like
lesions identified.

Vascular/Lymphatic: Normal caliber abdominal aorta. No
pathologically enlarged abdominal or pelvic lymph nodes.

Reproductive: Enlarged prostate measuring 5.7 x 4.6 x 5.8 cm (volume
= 80 cm^3).

Other: No significant abdominopelvic free fluid. Small bilateral fat
containing inguinal hernias.

Musculoskeletal: No acute or significant osseous findings.
IMPRESSION: 1. No acute intra-abdominal or pelvic pathology identified.
Specifically, the appendix and terminal ileum appear normal.
2. Enlarged prostate gland.
3. Probable hepatic steatosis.
4. Small bilateral fat containing inguinal hernias.

## 2022-12-24 ENCOUNTER — Ambulatory Visit (INDEPENDENT_AMBULATORY_CARE_PROVIDER_SITE_OTHER): Payer: 59

## 2022-12-24 ENCOUNTER — Encounter: Payer: Self-pay | Admitting: Family Medicine

## 2022-12-24 DIAGNOSIS — R300 Dysuria: Secondary | ICD-10-CM

## 2022-12-24 LAB — POCT URINALYSIS DIPSTICK
Bilirubin, UA: NEGATIVE
Blood, UA: NEGATIVE
Glucose, UA: NEGATIVE
Ketones, UA: NEGATIVE
Nitrite, UA: NEGATIVE
Protein, UA: NEGATIVE
Spec Grav, UA: 1.01 (ref 1.010–1.025)
Urobilinogen, UA: 0.2 U/dL
pH, UA: 6 (ref 5.0–8.0)

## 2022-12-24 MED ORDER — CIPROFLOXACIN HCL 500 MG PO TABS: 500.0000 mg | ORAL_TABLET | Freq: Two times a day (BID) | ORAL | 0 refills | Status: AC

## 2022-12-24 NOTE — Addendum Note (Signed)
Addended by: Eldred Manges on: 12/24/2022 04:41 PM   Modules accepted: Orders

## 2022-12-24 NOTE — Telephone Encounter (Addendum)
Pt has been contacted notes he is feeling better than before however still having some discomfort at the end of stream, asked if we needed to repeat the sample I told him it would not hurt and and made a nurse visit to drop off urine sample  Sample results in the note below for dipstick, can send Culture if you would like as well

## 2022-12-24 NOTE — Addendum Note (Signed)
Addended by: Eldred Manges on: 12/24/2022 02:11 PM   Modules accepted: Orders

## 2022-12-24 NOTE — Telephone Encounter (Signed)
With prior elevated PSA, on treatment for prostatitis.  Sometimes we do need to have a prolonged course of antibiotics for that infection.  Unfortunately had a suspected drug rash from Septra.  1 week of Cipro provided November 7.  Point-of-care urinalysis noted below.  Will extend Cipro for 1 additional week, but have Vincent Case update me on symptoms at that time. I called patient to discuss this plan, but no answer. Please call Vincent Case to discuss this plan but let me know if there are questions.   Results for orders placed or performed in visit on 12/16/22  POCT Urinalysis Dipstick  Result Value Ref Range   Color, UA yellow    Clarity, UA clear    Glucose, UA Negative Negative   Bilirubin, UA Neg    Ketones, UA Neg    Spec Grav, UA 1.010 1.010 - 1.025   Blood, UA Neg    pH, UA 6.0 5.0 - 8.0   Protein, UA Negative Negative   Urobilinogen, UA 0.2 0.2 or 1.0 E.U./dL   Nitrite, UA Neg    Leukocytes, UA Small (1+) (A) Negative   Appearance     Odor

## 2022-12-24 NOTE — Telephone Encounter (Signed)
Pt called stating he has finished ciprofloxacin (CIPRO) 500 MG tablet and is still not feeling 100% much better so what do you advise next?

## 2022-12-24 NOTE — Addendum Note (Signed)
Addended by: Meredith Staggers R on: 12/24/2022 06:08 PM   Modules accepted: Orders

## 2022-12-24 NOTE — Progress Notes (Signed)
Vincent Case presents to the office today to submit a urine sample per provider's orders.  Urinalysis ordered, collected and resulted? Yes Urine culture needed? Unknown at this time waiting on provider response  If culture is needed: Future ordered and placed in lab? yes

## 2022-12-25 ENCOUNTER — Telehealth: Payer: Self-pay | Admitting: Family Medicine

## 2022-12-25 DIAGNOSIS — N410 Acute prostatitis: Secondary | ICD-10-CM

## 2022-12-25 LAB — URINE CULTURE
MICRO NUMBER:: 15731595
Result:: NO GROWTH
SPECIMEN QUALITY:: ADEQUATE

## 2022-12-25 NOTE — Telephone Encounter (Signed)
Pt called regarding lab results. Stating Thea Silversmith called him. I told him someone would call him back to go over his results.

## 2022-12-25 NOTE — Telephone Encounter (Signed)
Called and LM to call back so we can discuss this plan with him

## 2022-12-25 NOTE — Telephone Encounter (Signed)
Patient has been informed of this plan notes no questions and discussed how to send message in MyChart and he will reach out in one week to discuss if he is having any symptoms

## 2022-12-25 NOTE — Telephone Encounter (Signed)
Called and let patient and let him know about extending cipro see other phone note

## 2023-01-01 ENCOUNTER — Telehealth: Payer: Self-pay | Admitting: Family Medicine

## 2023-01-01 ENCOUNTER — Other Ambulatory Visit (INDEPENDENT_AMBULATORY_CARE_PROVIDER_SITE_OTHER): Payer: 59

## 2023-01-01 DIAGNOSIS — N410 Acute prostatitis: Secondary | ICD-10-CM

## 2023-01-01 LAB — POCT URINALYSIS DIPSTICK
Blood, UA: NEGATIVE
Glucose, UA: NEGATIVE
Ketones, UA: NEGATIVE
Leukocytes, UA: NEGATIVE
Nitrite, UA: NEGATIVE
Protein, UA: NEGATIVE
Spec Grav, UA: 1.01 (ref 1.010–1.025)
Urobilinogen, UA: 0.2 U/dL
pH, UA: 7 (ref 5.0–8.0)

## 2023-01-01 LAB — PSA: PSA: 3.16 ng/mL (ref 0.10–4.00)

## 2023-01-01 NOTE — Addendum Note (Signed)
Addended by: Meredith Staggers R on: 01/01/2023 09:08 AM   Modules accepted: Orders

## 2023-01-01 NOTE — Telephone Encounter (Signed)
Pt calling after a week of taking Cipro. He feels much better each day still not quite 100% out Cipro took last dose last night. He would advice on what to do next.

## 2023-01-01 NOTE — Addendum Note (Signed)
Addended by: Eldred Manges on: 01/01/2023 10:40 AM   Modules accepted: Orders

## 2023-01-01 NOTE — Telephone Encounter (Signed)
Return for lab only visit today if possible for urinalysis and PSA.  Depending on those results, can decide on extending course of antibiotics.  If any worsening symptoms, let me know right away.  Thanks

## 2023-01-01 NOTE — Telephone Encounter (Signed)
Urinalysis noted.  Previously had trace or small leukocytes, but negative on urinalysis today.  PSA pending.  Will hold on new antibiotics for now unless symptoms worsen.  Please let him know that I will keep an eye out for the PSA result and then can decide on changes based on that test.  If any worsening urinary symptoms, let me know right away.  I expect him to continue to improve.  Please let me know if he has questions.  Thanks.

## 2023-01-01 NOTE — Telephone Encounter (Signed)
Please advise patient has finished second round of ABX

## 2023-01-01 NOTE — Telephone Encounter (Signed)
POC Urine has been ordered and resulted if you would like to review in chart, appears normal today but we are also waiting on PSA result

## 2023-01-01 NOTE — Telephone Encounter (Signed)
Pt has been informed or plan and will await final plan once results from blood are in

## 2023-01-15 ENCOUNTER — Encounter: Payer: Self-pay | Admitting: Family Medicine

## 2023-01-15 DIAGNOSIS — N410 Acute prostatitis: Secondary | ICD-10-CM

## 2023-01-15 NOTE — Telephone Encounter (Signed)
Pt would like a call Neva Seat ASAP.

## 2023-01-15 NOTE — Telephone Encounter (Signed)
Patient reports an increase in sxs this week off the abx and is asking what he should do.

## 2023-01-15 NOTE — Telephone Encounter (Signed)
If possible I recommend an in-person visit with any provider later today for repeat urine testing, PSA and decision on extension of antibiotic.  Would also consider meeting with urology at this point, but would want to make sure we do not need to adjust meds prior to that visit.  Please schedule with any open provider today if possible, as I am not in the office this afternoon.  Thanks.

## 2023-01-16 ENCOUNTER — Other Ambulatory Visit: Payer: Self-pay | Admitting: Family Medicine

## 2023-01-16 DIAGNOSIS — B009 Herpesviral infection, unspecified: Secondary | ICD-10-CM

## 2023-01-18 ENCOUNTER — Ambulatory Visit: Payer: 59

## 2023-01-18 ENCOUNTER — Other Ambulatory Visit: Payer: Self-pay

## 2023-01-18 DIAGNOSIS — N410 Acute prostatitis: Secondary | ICD-10-CM | POA: Diagnosis not present

## 2023-01-18 LAB — POCT URINALYSIS DIPSTICK
Bilirubin, UA: NEGATIVE
Blood, UA: NEGATIVE
Glucose, UA: NEGATIVE
Ketones, UA: NEGATIVE
Nitrite, UA: NEGATIVE
Protein, UA: NEGATIVE
Spec Grav, UA: 1.01 (ref 1.010–1.025)
Urobilinogen, UA: 0.2 U/dL
pH, UA: 7.5 (ref 5.0–8.0)

## 2023-01-18 LAB — PSA: PSA: 6.09 ng/mL — ABNORMAL HIGH (ref 0.10–4.00)

## 2023-01-18 NOTE — Progress Notes (Signed)
 Vincent Case presents to the office today to submit a urine sample per provider's orders.  Urinalysis ordered, collected and resulted? Yes Urine culture needed? No If culture is needed: Future ordered and placed in lab? not applicable

## 2023-01-18 NOTE — Telephone Encounter (Signed)
Patient was told on Friday afternoon that he needed to be seen per Dr. Neva Seat. Patient refused this multiple times and refused going to urgent care also. Patient came in today to submit a urine sample and was told again that he needed an appt. He was scheduled for Wednesday and submitted his urine sample today and also had a PSA drawn. I advised Erie Noe to ask Thea Silversmith if Dr. Neva Seat was ok with him submitting his urine today since I was unaware of any new orders and Thea Silversmith had been handling this prior. Orders were placed for the urine and PSA.

## 2023-01-19 MED ORDER — CIPROFLOXACIN HCL 500 MG PO TABS: 500.0000 mg | ORAL_TABLET | Freq: Two times a day (BID) | ORAL | 0 refills | Status: AC

## 2023-01-19 NOTE — Addendum Note (Signed)
Addended by: Meredith Staggers R on: 01/19/2023 05:39 PM   Modules accepted: Orders

## 2023-01-19 NOTE — Telephone Encounter (Signed)
Noted, see lab notes.

## 2023-01-19 NOTE — Telephone Encounter (Signed)
Discussed with patient, will prescribe additional 2 weeks of Cipro as likely persistent or subacute prostatitis.  Refer to urology.  See other details on lab notes.  Please cancel appointment tomorrow with Dr. Beverely Low.

## 2023-01-20 ENCOUNTER — Ambulatory Visit: Payer: 59 | Admitting: Family Medicine

## 2023-02-02 NOTE — Telephone Encounter (Signed)
Please advise pt reports sxs are not resolved

## 2023-02-05 NOTE — Telephone Encounter (Signed)
error 

## 2023-08-05 ENCOUNTER — Other Ambulatory Visit: Payer: Self-pay | Admitting: Family Medicine

## 2023-08-05 DIAGNOSIS — E785 Hyperlipidemia, unspecified: Secondary | ICD-10-CM

## 2023-08-05 DIAGNOSIS — I10 Essential (primary) hypertension: Secondary | ICD-10-CM

## 2023-08-05 NOTE — Telephone Encounter (Signed)
 Patient needs an appointment

## 2023-08-19 ENCOUNTER — Ambulatory Visit (INDEPENDENT_AMBULATORY_CARE_PROVIDER_SITE_OTHER): Admitting: Family Medicine

## 2023-08-19 ENCOUNTER — Encounter: Payer: Self-pay | Admitting: Family Medicine

## 2023-08-19 VITALS — BP 124/70 | HR 76 | Temp 98.0°F | Resp 13 | Ht 73.0 in | Wt 152.2 lb

## 2023-08-19 DIAGNOSIS — I10 Essential (primary) hypertension: Secondary | ICD-10-CM | POA: Diagnosis not present

## 2023-08-19 DIAGNOSIS — N410 Acute prostatitis: Secondary | ICD-10-CM

## 2023-08-19 DIAGNOSIS — B009 Herpesviral infection, unspecified: Secondary | ICD-10-CM

## 2023-08-19 DIAGNOSIS — E785 Hyperlipidemia, unspecified: Secondary | ICD-10-CM

## 2023-08-19 DIAGNOSIS — Z0184 Encounter for antibody response examination: Secondary | ICD-10-CM

## 2023-08-19 LAB — COMPREHENSIVE METABOLIC PANEL WITH GFR
ALT: 20 U/L (ref 0–53)
AST: 25 U/L (ref 0–37)
Albumin: 4.7 g/dL (ref 3.5–5.2)
Alkaline Phosphatase: 78 U/L (ref 39–117)
BUN: 11 mg/dL (ref 6–23)
CO2: 32 meq/L (ref 19–32)
Calcium: 9.4 mg/dL (ref 8.4–10.5)
Chloride: 100 meq/L (ref 96–112)
Creatinine, Ser: 0.91 mg/dL (ref 0.40–1.50)
GFR: 93.16 mL/min (ref >60.00)
Glucose, Bld: 92 mg/dL (ref 70–99)
Potassium: 4.5 meq/L (ref 3.5–5.1)
Sodium: 138 meq/L (ref 135–145)
Total Bilirubin: 0.9 mg/dL (ref 0.2–1.2)
Total Protein: 6.9 g/dL (ref 6.0–8.3)

## 2023-08-19 LAB — LIPID PANEL
Cholesterol: 174 mg/dL (ref 0–200)
HDL: 91.3 mg/dL (ref >39.00)
LDL Cholesterol: 67 mg/dL (ref 0–99)
NonHDL: 82.47
Total CHOL/HDL Ratio: 2
Triglycerides: 76 mg/dL (ref 0.0–149.0)
VLDL: 15.2 mg/dL (ref 0.0–40.0)

## 2023-08-19 LAB — PSA: PSA: 3 ng/mL (ref 0.10–4.00)

## 2023-08-19 MED ORDER — ACYCLOVIR 400 MG PO TABS: 400.0000 mg | ORAL_TABLET | Freq: Every day | ORAL | 3 refills | Status: DC

## 2023-08-19 NOTE — Progress Notes (Signed)
 Subjective:  Patient ID: Vincent Case, male    DOB: May 21, 1965  Age: 58 y.o. MRN: [id number]  CC:  Chief Complaint  Patient presents with   Medical Management of Chronic Issues    Pt notes no concerns, doing well,     HPI Vincent Case presents for follow up.  No health changes. Son admitted to [address] - will start in the fall.   History of urinary frequency,  elevated PSA.  Treated by urology previously, with renal cyst, calculus, Dr. Watt.  Last discussed in October 2024 with some incomplete emptying of bladder sensation at that time.  Previously had been treated for prostatitis.  Prior reassuring STI screening urine culture and point-of-care urinalysis.  Had been treated with antibiotics with minimal change in symptoms, including prior treatment for possible NGU with doxycycline . PSA had increased from August 2024 to October 2024 at 7.29, improved to 3.16 in November then increased again to 6.09 in December.  Had been treated with course of Cipro , referred to urology. Office visit noted from January 7, thought to be acute prostatitis, uncomplicated, plan for an additional month of Cipro  and then plan for repeat PSA in 6 weeks.  Did not have repeat test - has appt next month.  Urinating ok. Rare sensation in area. Not like in past. Appt with urology in August.   Hyperlipidemia: Crestor  20 mg daily, no new side effects.  Lab Results  Component Value Date   CHOL 151 06/25/2021   HDL 56.20 06/25/2021   LDLCALC 90 10/04/2020   LDLDIRECT 68.0 06/25/2021   TRIG 275.0 (H) 06/25/2021   CHOLHDL 3 06/25/2021   Lab Results  Component Value Date   ALT 24 06/25/2021   AST 17 06/25/2021   ALKPHOS 75 06/25/2021   BILITOT 0.5 06/25/2021   Hypertension: Amlodipine  benazepril  5/20 mg daily. No side effects.  Home readings: 120-130/70 range.  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 Rare flare., mild.   Hep B titer - unsure if he had vaccine.   Has physical at work in October.   As an aside, injured right knee jumping last weekend, no pop.  has been improving with use of brace, able to weight-bear without difficulty.  Plans on follow-up visit in next week or 2 if not continuing to improve.  History Patient Active Problem List   Diagnosis Date Noted   Hypertension 12/08/2019   Hyperlipidemia 12/08/2019   Family history of early CAD 12/08/2019   Past Medical History:  Diagnosis Date   Hyperlipidemia    Hypertension    Past Surgical History:  Procedure Laterality Date   FRACTURE SURGERY  1984   Allergies  Allergen Reactions   Bactrim  [Sulfamethoxazole -Trimethoprim ] Rash   Prior to Admission medications   Medication Sig Start Date End Date Taking? Authorizing Provider  acyclovir  (ZOVIRAX ) 400 MG tablet TAKE 1 TABLET DAILY 01/18/23  Yes Levora Reyes SAUNDERS, MD  amLODipine -benazepril  (LOTREL) 5-20 MG capsule TAKE 1 CAPSULE DAILY 08/05/23  Yes Levora Reyes SAUNDERS, MD  Multiple Vitamins-Minerals (MENS MULTIVITAMIN PO) Take by mouth.   Yes [provider]  rosuvastatin  (CRESTOR ) 20 MG tablet TAKE 1 TABLET DAILY 08/05/23  Yes Levora Reyes SAUNDERS, MD  ondansetron  (ZOFRAN ) 4 MG tablet Take 1 tablet (4 mg total) by mouth every 8 (eight) hours  as needed for nausea or vomiting. Patient not taking: Reported on 08/19/2023 12/10/22   Levora Reyes SAUNDERS, MD  predniSONE  (DELTASONE ) 10 MG tablet 3 tabs x3 days and then 2 tabs x3 days and then 1 tab x3 days.  Take w/ food. Patient not taking: Reported on 08/19/2023 12/17/22   Mahlon Comer BRAVO, MD  sulfamethoxazole -trimethoprim  (BACTRIM  DS) 800-160 MG tablet Take 1 tablet by mouth 2 (two) times daily. Patient not taking: Reported on 08/19/2023 12/02/22   Levora Reyes SAUNDERS, MD   Social History   Socioeconomic History   Marital status:  Married    Spouse name: Not on file   Number of children: Not on file   Years of education: Not on file   Highest education level: Some college, no degree  Occupational History   Not on file  Tobacco Use   Smoking status: Never   Smokeless tobacco: Never  Vaping Use   Vaping status: Never Used  Substance and Sexual Activity   Alcohol use: Yes   Drug use: Never   Sexual activity: Yes    Birth control/protection: None  Other Topics Concern   Not on file  Social History Narrative   Not on file   Social Drivers of Health   Financial Resource Strain: Low Risk  (08/15/2023)   Overall Financial Resource Strain (CARDIA)    Difficulty of Paying Living Expenses: Not hard at all  Food Insecurity: No Food Insecurity (08/15/2023)   Hunger Vital Sign    Worried About Running Out of Food in the Last Year: Never true    Ran Out of Food in the Last Year: Never true  Transportation Needs: No Transportation Needs (08/15/2023)   PRAPARE - Administrator, Civil Service (Medical): No    Lack of Transportation (Non-Medical): No  Physical Activity: Sufficiently Active (08/15/2023)   Exercise Vital Sign    Days of Exercise per Week: 3 days    Minutes of Exercise per Session: 50 min  Stress: No Stress Concern Present (08/15/2023)   [employer] of Occupational Health - Occupational Stress Questionnaire    Feeling of Stress: Only a little  Social Connections: Moderately Integrated (08/15/2023)   Social Connection and Isolation Panel    Frequency of Communication with Friends and Family: Twice a week    Frequency of Social Gatherings with Friends and Family: Three times a week    Attends Religious Services: 1 to 4 times per year    Active Member of Clubs or Organizations: No    Attends Engineer, structural: Not on file    Marital Status: Married  Catering manager Violence: Not on file    Review of Systems  Constitutional:  Negative for fatigue and unexpected weight change.   Eyes:  Negative for visual disturbance.  Respiratory:  Negative for cough, chest tightness and shortness of breath.   Cardiovascular:  Negative for chest pain, palpitations and leg swelling.  Gastrointestinal:  Negative for abdominal pain and blood in stool.  Neurological:  Negative for dizziness, light-headedness and headaches.     Objective:   Vitals:   08/19/23 1058  BP: 124/70  Pulse: 76  Resp: 13  Temp: 98 F (36.7 C)  TempSrc: Temporal  SpO2: 99%  Weight: 152 lb 3.2 oz (69 kg)  Height: 6' 1 (1.854 m)     Physical Exam Vitals reviewed.  Constitutional:      Appearance: He is well-developed.  HENT:     Head: Normocephalic and atraumatic.  Neck:     Vascular: No carotid bruit or JVD.  Cardiovascular:     Rate and Rhythm: Normal rate and regular rhythm.     Heart sounds: Normal heart sounds. No murmur heard. Pulmonary:     Effort: Pulmonary effort is normal.     Breath sounds: Normal breath sounds. No rales.  Musculoskeletal:     Right lower leg: No edema.     Left lower leg: No edema.  Skin:    General: Skin is warm and dry.  Neurological:     Mental Status: He is alert and oriented to person, place, and time.  Psychiatric:        Mood and Affect: Mood normal.        Assessment & Plan:  Banks Chaikin is a 58 y.o. male . Hyperlipidemia, unspecified hyperlipidemia type - Plan: Comprehensive metabolic panel with GFR, Lipid panel Tolerating Crestor , check labs, no med changes at this time.  Acute prostatitis - Plan: PSA  - Prior prostatitis, check updated PSA and follow-up with urology.  Primary hypertension  - Stable, continue same dose Lotrel.  Labs as above  HSV-1 infection - Plan: acyclovir  (ZOVIRAX ) 400 MG tablet  - Meds refilled.  Immunity status testing - Plan: Hepatitis B surface antibody,qualitative Depending on results consider nurse visit for hep B vaccine.  Follow-up if knee symptoms not continuing to improve.  Meds ordered this  encounter  Medications   acyclovir  (ZOVIRAX ) 400 MG tablet    Sig: Take 1 tablet (400 mg total) by mouth daily.    Dispense:  90 tablet    Refill:  3   Patient Instructions  No med changes at this time.  I sent another refill of acyclovir  and, other meds were recently refilled.  If any concerns on labs I will let you know.  I did check the PSA level but should follow-up with urology as planned, and if that level is elevated may need to coordinate plan with urology.  Glad to hear that knee is improving, follow-up if that does not continue to improve and we can look at that further including exam and possible x-rays if needed.  Take care!    Signed,   Reyes Pines, MD [facility], [facility] 08/19/23 11:42 AM

## 2023-08-19 NOTE — Patient Instructions (Signed)
 No med changes at this time.  I sent another refill of acyclovir  and, other meds were recently refilled.  If any concerns on labs I will let you know.  I did check the PSA level but should follow-up with urology as planned, and if that level is elevated may need to coordinate plan with urology.  Glad to hear that knee is improving, follow-up if that does not continue to improve and we can look at that further including exam and possible x-rays if needed.  Take care!

## 2023-08-20 LAB — HEPATITIS B SURFACE ANTIBODY,QUALITATIVE: Hep B S Ab: NONREACTIVE

## 2023-08-22 ENCOUNTER — Ambulatory Visit: Payer: Self-pay | Admitting: Family Medicine

## 2024-01-02 ENCOUNTER — Ambulatory Visit

## 2024-01-03 ENCOUNTER — Other Ambulatory Visit: Payer: Self-pay

## 2024-01-03 ENCOUNTER — Ambulatory Visit: Admitting: Radiology

## 2024-01-03 ENCOUNTER — Ambulatory Visit
Admission: RE | Admit: 2024-01-03 | Discharge: 2024-01-03 | Disposition: A | Discharge status: Home / Self Care | Attending: Student | Admitting: Student

## 2024-01-03 VITALS — BP 123/80 | HR 72 | Temp 98.6°F | Resp 18 | Ht 73.0 in | Wt 160.0 lb

## 2024-01-03 DIAGNOSIS — S92424A Nondisplaced fracture of distal phalanx of right great toe, initial encounter for closed fracture: Secondary | ICD-10-CM | POA: Diagnosis not present

## 2024-01-03 DIAGNOSIS — S99921A Unspecified injury of right foot, initial encounter: Secondary | ICD-10-CM

## 2024-01-03 NOTE — Discharge Instructions (Addendum)
-  Xray result: Comminuted fracture of the great toe distal phalanx with intra-articular extension.

## 2024-01-03 NOTE — ED Triage Notes (Signed)
 Pt presents with a chief complaint of right great toe injury. States he stubbed his great toe on a table Friday night 11/21. Currently rates his pain a 1/10 while sitting in triage however with weight-bearing activities/ambulation, pain increases to a 10/10. Describes pain as throbbing. Ice applied with improvement. Advil taken Friday night after incident occurred. Swelling present per pt.

## 2024-01-03 NOTE — ED Provider Notes (Signed)
 [facility]    CSN: [id number] Arrival date & time: 01/03/24  9043      History   Chief Complaint Chief Complaint  Patient presents with   Toe Injury    Entered by patient    HPI Efe Fazzino is a 58 y.o. male presenting following R great toe injury that occurred on 12/31/23. Pt presents with a chief complaint of right great toe injury. States he stubbed his great toe on a table Friday night 11/21. Currently rates his pain a 1/10 while sitting in triage however with weight-bearing activities/ambulation, pain increases to a 10/10. Describes pain as throbbing. Ice applied with improvement. Advil taken Friday night after incident occurred. Swelling present per pt.   HPI  Past Medical History:  Diagnosis Date   Hyperlipidemia    Hypertension     Patient Active Problem List   Diagnosis Date Noted   Hypertension 12/08/2019   Hyperlipidemia 12/08/2019   Family history of early CAD 12/08/2019    Past Surgical History:  Procedure Laterality Date   FRACTURE SURGERY  1984       Home Medications    Prior to Admission medications   Medication Sig Start Date End Date Taking? Authorizing Provider  acyclovir  (ZOVIRAX ) 400 MG tablet Take 1 tablet (400 mg total) by mouth daily. 08/19/23   Levora Reyes SAUNDERS, MD  amLODipine -benazepril  (LOTREL) 5-20 MG capsule TAKE 1 CAPSULE DAILY 08/05/23   Levora Reyes SAUNDERS, MD  Multiple Vitamins-Minerals (MENS MULTIVITAMIN PO) Take by mouth.    [provider]  rosuvastatin  (CRESTOR ) 20 MG tablet TAKE 1 TABLET DAILY 08/05/23   Levora Reyes SAUNDERS, MD    Family History Family History  Problem Relation Age of Onset   Early death Father        Heart Attachl   Cancer Maternal Grandmother     Social History Social History   Tobacco Use   Smoking status: Never   Smokeless tobacco: Never  Vaping Use   Vaping status: Never Used  Substance Use Topics   Alcohol use: Yes   Drug use: Never     Allergies   Bactrim   [sulfamethoxazole -trimethoprim ]   Review of Systems Review of Systems  Musculoskeletal:        R great toe pain     Physical Exam Triage Vital Signs ED Triage Vitals  Encounter Vitals Group     BP      Girls Systolic BP Percentile      Girls Diastolic BP Percentile      Boys Systolic BP Percentile      Boys Diastolic BP Percentile      Pulse      Resp      Temp      Temp src      SpO2      Weight      Height      Head Circumference      Peak Flow      Pain Score      Pain Loc      Pain Education      Exclude from Growth Chart    No data found.  Updated Vital Signs BP 123/80 (BP Location: Right Arm)   Pulse 72   Temp 98.6 F (37 C) (Oral)   Resp 18   Ht 6' 1 (1.854 m)   Wt 160 lb (72.6 kg)   SpO2 98%   BMI 21.11 kg/m   Visual Acuity Right Eye Distance:   Left Eye  Distance:   Bilateral Distance:    Right Eye Near:   Left Eye Near:    Bilateral Near:     Physical Exam Vitals reviewed.  Constitutional:      General: He is not in acute distress.    Appearance: Normal appearance. He is not ill-appearing.  HENT:     Head: Normocephalic and atraumatic.  Pulmonary:     Effort: Pulmonary effort is normal.  Musculoskeletal:     Comments: R great toe: ecchymosis over the dorsal aspect of the distal phalanx, extending to the proximal nail bed. The distal phalanx is TTP.   MTP joints 1-3: faint ecchymosis and mildly TTP.   DP 2+, cap refill <2 seconds. There is no midfoot or malleolar tenderness.   Neurological:     General: No focal deficit present.     Mental Status: He is alert and oriented to person, place, and time.  Psychiatric:        Mood and Affect: Mood normal.        Behavior: Behavior normal.        Thought Content: Thought content normal.        Judgment: Judgment normal.      UC Treatments / Results  Labs (all labs ordered are listed, but only abnormal results are displayed) Labs Reviewed - No data to  display  EKG   Radiology DG Foot Complete Right Result Date: 01/03/2024 CLINICAL DATA:  Right great toe injury. EXAM: RIGHT FOOT COMPLETE - 3+ VIEW COMPARISON:  None Available. FINDINGS: Comminuted fracture of the great toe distal phalanx noted with intra-articular extension. No other acute fracture evident. No subluxation or dislocation. No worrisome lytic or sclerotic osseous abnormality. IMPRESSION: Comminuted fracture of the great toe distal phalanx with intra-articular extension. Electronically Signed   By: Camellia Candle M.D.   On: 01/03/2024 10:59    Procedures Procedures (including critical care time)  Medications Ordered in UC Medications - No data to display  Initial Impression / Assessment and Plan / UC Course  I have reviewed the triage vital signs and the nursing notes.  Pertinent labs & imaging results that were available during my care of the patient were reviewed by me and considered in my medical decision making (see chart for details).     Patient is a 58 year old man presenting with right great toe fracture.  Neurovascularly intact.  Xray R foot: Comminuted fracture of the great toe distal phalanx with intra-articular extension.  Placed in postop shoe.  RICE.  Follow-up with [facility].  Final Clinical Impressions(s) / UC Diagnoses   Final diagnoses:  Injury of right great toe, initial encounter  Closed nondisplaced fracture of distal phalanx of right great toe, initial encounter     Discharge Instructions       -Xray result: Comminuted fracture of the great toe distal phalanx with intra-articular extension.     ED Prescriptions   None    PDMP not reviewed this encounter.   Arlyss Leita BRAVO, PA-C 01/03/24 1120

## 2024-03-03 ENCOUNTER — Encounter: Payer: Self-pay | Admitting: *Deleted

## 2024-03-06 ENCOUNTER — Ambulatory Visit: Admitting: Family Medicine

## 2024-03-06 ENCOUNTER — Other Ambulatory Visit: Payer: Self-pay

## 2024-03-06 DIAGNOSIS — I10 Essential (primary) hypertension: Secondary | ICD-10-CM

## 2024-03-06 DIAGNOSIS — B009 Herpesviral infection, unspecified: Secondary | ICD-10-CM

## 2024-03-06 DIAGNOSIS — E785 Hyperlipidemia, unspecified: Secondary | ICD-10-CM

## 2024-03-06 MED ORDER — ACYCLOVIR 400 MG PO TABS: 400.0000 mg | ORAL_TABLET | Freq: Every day | ORAL | 3 refills | Status: AC

## 2024-03-06 MED ORDER — AMLODIPINE BESY-BENAZEPRIL HCL 5-20 MG PO CAPS: 1.0000 | ORAL_CAPSULE | Freq: Every day | ORAL | 3 refills | Status: AC

## 2024-03-06 MED ORDER — ROSUVASTATIN CALCIUM 20 MG PO TABS: 20.0000 mg | ORAL_TABLET | Freq: Every day | ORAL | 3 refills | Status: AC

## 2024-07-10 ENCOUNTER — Encounter: Admitting: Family Medicine
